# Patient Record
Sex: Female | Born: 1975 | Race: White | Hispanic: No | Marital: Married | Smoking: Never smoker
Health system: Southern US, Community
[De-identification: ages and names within clinical notes are randomized; demographics above are authoritative.]

## PROBLEM LIST (undated history)

## (undated) DIAGNOSIS — G4730 Sleep apnea, unspecified: Secondary | ICD-10-CM

## (undated) DIAGNOSIS — I10 Essential (primary) hypertension: Secondary | ICD-10-CM

## (undated) DIAGNOSIS — M109 Gout, unspecified: Secondary | ICD-10-CM

## (undated) DIAGNOSIS — E669 Obesity, unspecified: Secondary | ICD-10-CM

## (undated) DIAGNOSIS — E119 Type 2 diabetes mellitus without complications: Secondary | ICD-10-CM

## (undated) DIAGNOSIS — F419 Anxiety disorder, unspecified: Secondary | ICD-10-CM

## (undated) DIAGNOSIS — M1990 Unspecified osteoarthritis, unspecified site: Secondary | ICD-10-CM

## (undated) HISTORY — DX: Type 2 diabetes mellitus without complications: E11.9

## (undated) HISTORY — DX: Obesity, unspecified: E66.9

## (undated) HISTORY — DX: Unspecified osteoarthritis, unspecified site: M19.90

## (undated) HISTORY — PX: DILATION AND CURETTAGE OF UTERUS: SHX78

## (undated) HISTORY — DX: Gout, unspecified: M10.9

## (undated) HISTORY — DX: Sleep apnea, unspecified: G47.30

## (undated) HISTORY — PX: OTHER SURGICAL HISTORY: SHX169

## (undated) HISTORY — DX: Anxiety disorder, unspecified: F41.9

---

## 2000-05-26 ENCOUNTER — Encounter: Admission: RE | Admit: 2000-05-26 | Discharge: 2000-08-24 | Payer: Self-pay | Admitting: *Deleted

## 2000-07-27 ENCOUNTER — Emergency Department (HOSPITAL_COMMUNITY): Admission: EM | Admit: 2000-07-27 | Discharge: 2000-07-27 | Payer: Self-pay | Admitting: Emergency Medicine

## 2000-09-06 ENCOUNTER — Encounter: Payer: Self-pay | Admitting: *Deleted

## 2000-09-06 ENCOUNTER — Encounter: Admission: RE | Admit: 2000-09-06 | Discharge: 2000-09-06 | Payer: Self-pay | Admitting: *Deleted

## 2000-09-15 ENCOUNTER — Emergency Department (HOSPITAL_COMMUNITY): Admission: EM | Admit: 2000-09-15 | Discharge: 2000-09-15 | Payer: Self-pay | Admitting: Emergency Medicine

## 2000-10-30 ENCOUNTER — Encounter: Admission: RE | Admit: 2000-10-30 | Discharge: 2000-10-30 | Payer: Self-pay | Admitting: Gastroenterology

## 2000-10-30 ENCOUNTER — Encounter: Payer: Self-pay | Admitting: Gastroenterology

## 2000-11-17 ENCOUNTER — Encounter: Payer: Self-pay | Admitting: Gastroenterology

## 2000-11-17 ENCOUNTER — Ambulatory Visit (HOSPITAL_COMMUNITY): Admission: RE | Admit: 2000-11-17 | Discharge: 2000-11-17 | Payer: Self-pay | Admitting: Gastroenterology

## 2000-12-10 ENCOUNTER — Emergency Department (HOSPITAL_COMMUNITY): Admission: EM | Admit: 2000-12-10 | Discharge: 2000-12-11 | Payer: Self-pay | Admitting: *Deleted

## 2001-03-06 ENCOUNTER — Encounter: Payer: Self-pay | Admitting: Orthopaedic Surgery

## 2001-03-06 ENCOUNTER — Ambulatory Visit (HOSPITAL_COMMUNITY): Admission: RE | Admit: 2001-03-06 | Discharge: 2001-03-06 | Payer: Self-pay | Admitting: Orthopaedic Surgery

## 2001-09-21 ENCOUNTER — Emergency Department (HOSPITAL_COMMUNITY): Admission: EM | Admit: 2001-09-21 | Discharge: 2001-09-21 | Payer: Self-pay | Admitting: Emergency Medicine

## 2001-09-25 ENCOUNTER — Other Ambulatory Visit: Admission: RE | Admit: 2001-09-25 | Discharge: 2001-09-25 | Payer: Self-pay | Admitting: *Deleted

## 2002-08-21 ENCOUNTER — Emergency Department (HOSPITAL_COMMUNITY): Admission: EM | Admit: 2002-08-21 | Discharge: 2002-08-22 | Payer: Self-pay | Admitting: Emergency Medicine

## 2002-10-14 ENCOUNTER — Other Ambulatory Visit: Admission: RE | Admit: 2002-10-14 | Discharge: 2002-10-14 | Payer: Self-pay | Admitting: *Deleted

## 2003-04-09 ENCOUNTER — Emergency Department (HOSPITAL_COMMUNITY): Admission: EM | Admit: 2003-04-09 | Discharge: 2003-04-09 | Payer: Self-pay

## 2004-03-25 ENCOUNTER — Emergency Department (HOSPITAL_COMMUNITY): Admission: EM | Admit: 2004-03-25 | Discharge: 2004-03-25 | Payer: Self-pay | Admitting: Family Medicine

## 2004-07-11 ENCOUNTER — Emergency Department (HOSPITAL_COMMUNITY): Admission: EM | Admit: 2004-07-11 | Discharge: 2004-07-11 | Payer: Self-pay | Admitting: Family Medicine

## 2004-08-09 ENCOUNTER — Encounter (INDEPENDENT_AMBULATORY_CARE_PROVIDER_SITE_OTHER): Payer: Self-pay | Admitting: Specialist

## 2004-08-09 ENCOUNTER — Ambulatory Visit (HOSPITAL_COMMUNITY): Admission: RE | Admit: 2004-08-09 | Discharge: 2004-08-09 | Payer: Self-pay | Admitting: *Deleted

## 2005-12-13 ENCOUNTER — Encounter: Admission: RE | Admit: 2005-12-13 | Discharge: 2005-12-13 | Payer: Self-pay | Admitting: *Deleted

## 2006-01-20 ENCOUNTER — Encounter: Admission: RE | Admit: 2006-01-20 | Discharge: 2006-01-20 | Payer: Self-pay | Admitting: *Deleted

## 2007-01-22 ENCOUNTER — Encounter: Admission: RE | Admit: 2007-01-22 | Discharge: 2007-01-22 | Payer: Self-pay | Admitting: *Deleted

## 2008-01-24 ENCOUNTER — Encounter: Admission: RE | Admit: 2008-01-24 | Discharge: 2008-01-24 | Payer: Self-pay | Admitting: Obstetrics

## 2008-03-08 ENCOUNTER — Emergency Department (HOSPITAL_COMMUNITY): Admission: EM | Admit: 2008-03-08 | Discharge: 2008-03-08 | Payer: Self-pay | Admitting: Emergency Medicine

## 2009-02-03 ENCOUNTER — Encounter: Admission: RE | Admit: 2009-02-03 | Discharge: 2009-02-03 | Payer: Self-pay | Admitting: Obstetrics

## 2010-02-23 ENCOUNTER — Encounter: Admission: RE | Admit: 2010-02-23 | Discharge: 2010-02-23 | Payer: Self-pay | Admitting: Obstetrics

## 2010-05-01 ENCOUNTER — Inpatient Hospital Stay (HOSPITAL_COMMUNITY): Admission: AD | Admit: 2010-05-01 | Payer: Self-pay | Admitting: Obstetrics and Gynecology

## 2010-05-22 ENCOUNTER — Encounter: Payer: Self-pay | Admitting: Obstetrics

## 2010-09-17 NOTE — Op Note (Signed)
NAMESHARYL, PANCHAL             ACCOUNT NO.:  [id number]   MEDICAL RECORD NO.:  [id number]          PATIENT TYPE:  AMB   LOCATION:  SDC                           FACILITY:  [facility]   PHYSICIAN:  [place] B. Earlene Plater, M.D.  DATE OF BIRTH:  Jan 05, 1976   DATE OF PROCEDURE:  08/09/2004  DATE OF DISCHARGE:                                 OPERATIVE REPORT   PREOPERATIVE DIAGNOSIS:  Abnormal uterine bleeding.   POSTOPERATIVE DIAGNOSIS:  Abnormal uterine bleeding.   PROCEDURE:  Hysteroscopy, dilatation and curettage.   SURGEON:  Chester Holstein. Earlene Plater, M.D.   ANESTHESIA:  LMA general.   ASSISTANT:  None.   FINDINGS:  Abundant proliferative-appearing endometrial tissue, normal-  appearing tubal ostia, no other abnormalities.   SPECIMENS:  Endometrial curettings.   ESTIMATED BLOOD LOSS:  Less than 50.   COMPLICATIONS:  None.   FLUID DEFICIT:  20 mL sorbitol.   INDICATIONS:  Patient with a history of obesity, oligo-ovulation, and recent  history of increased heavy menstrual bleeding.  Pelvic ultrasound showed a  very thickened endometrial stripe.  Pregnancy was ruled out.  Given her  heavy bleeding, I recommended D&C for therapeutic purposes as she was not  responding to medical treatment and for definitive diagnosis along with  hysteroscopy.  The operative risks were discussed, including infection,  bleeding, uterine perforation, damage to surrounding organs, and fluid  overload.   PROCEDURE:  Patient taken to the operating room and LMA general anesthesia  obtained.  She was prepped and draped in standard fashion and the bladder  emptied with an in-and-out catheter.   Exam under anesthesia showed a normal-sized uterus, no masses palpable in  the adnexa, although the exam was somewhat limited by her obesity.   Speculum inserted, paracervical block placed with 20 mL of 1% Nesacaine.   A single-tooth attached to the cervix and the cervix was patulent after one  dose of  Cytotec last night.   It allowed passage up to the #21 dilator  without resistance.   The diagnostic hysteroscope was inserted after being flushed with sorbitol.  With good uterine distention, the endometrial cavity was inspected.  There  was a very proliferative appearance, particularly at the fundus.  No other  focal lesions were seen.  The endometrium was then curetted and copious  tissue returned.  The scope was reinserted and no other abnormalities were  noted, and it was clear that the majority of the endometrium had been  sharply curetted successfully.   The scope was removed and the single-tooth removed.  The single-tooth site  was bleeding.  It was made hemostatic with silver nitrate stick and direct  pressure.   The patient tolerated the procedure well with no complications.  She was  taken to the recovery room awake, in stable condition.      WBD/MEDQ  D:  08/09/2004  T:  08/09/2004  Job:  [id number]

## 2010-12-23 ENCOUNTER — Other Ambulatory Visit: Payer: Self-pay | Admitting: Obstetrics

## 2010-12-23 DIAGNOSIS — Z1231 Encounter for screening mammogram for malignant neoplasm of breast: Secondary | ICD-10-CM

## 2011-02-25 ENCOUNTER — Ambulatory Visit
Admission: RE | Admit: 2011-02-25 | Discharge: 2011-02-25 | Disposition: A | Discharge status: Home / Self Care | Payer: BC Managed Care – PPO | Source: Ambulatory Visit | Attending: Obstetrics | Admitting: Obstetrics

## 2011-02-25 DIAGNOSIS — Z1231 Encounter for screening mammogram for malignant neoplasm of breast: Secondary | ICD-10-CM

## 2012-02-08 ENCOUNTER — Other Ambulatory Visit: Payer: Self-pay | Admitting: Obstetrics

## 2012-02-08 ENCOUNTER — Other Ambulatory Visit: Payer: Self-pay | Admitting: *Deleted

## 2012-02-08 DIAGNOSIS — Z1231 Encounter for screening mammogram for malignant neoplasm of breast: Secondary | ICD-10-CM

## 2012-02-28 ENCOUNTER — Ambulatory Visit: Payer: BC Managed Care – PPO

## 2012-03-07 ENCOUNTER — Ambulatory Visit
Admission: RE | Admit: 2012-03-07 | Discharge: 2012-03-07 | Disposition: A | Discharge status: Home / Self Care | Payer: BC Managed Care – PPO | Source: Ambulatory Visit | Attending: Obstetrics | Admitting: Obstetrics

## 2012-03-07 DIAGNOSIS — Z1231 Encounter for screening mammogram for malignant neoplasm of breast: Secondary | ICD-10-CM

## 2013-03-05 ENCOUNTER — Other Ambulatory Visit: Payer: Self-pay | Admitting: Obstetrics

## 2013-03-05 DIAGNOSIS — Z803 Family history of malignant neoplasm of breast: Secondary | ICD-10-CM

## 2013-03-05 DIAGNOSIS — Z1231 Encounter for screening mammogram for malignant neoplasm of breast: Secondary | ICD-10-CM

## 2013-03-29 ENCOUNTER — Ambulatory Visit
Admission: RE | Admit: 2013-03-29 | Discharge: 2013-03-29 | Disposition: A | Discharge status: Home / Self Care | Payer: BC Managed Care – PPO | Source: Ambulatory Visit | Attending: Obstetrics | Admitting: Obstetrics

## 2013-03-29 DIAGNOSIS — Z1231 Encounter for screening mammogram for malignant neoplasm of breast: Secondary | ICD-10-CM

## 2013-03-29 DIAGNOSIS — Z803 Family history of malignant neoplasm of breast: Secondary | ICD-10-CM

## 2019-06-18 ENCOUNTER — Other Ambulatory Visit: Payer: Self-pay

## 2019-06-19 ENCOUNTER — Encounter: Payer: Self-pay | Admitting: Physician Assistant

## 2019-06-19 ENCOUNTER — Ambulatory Visit (INDEPENDENT_AMBULATORY_CARE_PROVIDER_SITE_OTHER): Payer: 59 | Admitting: Physician Assistant

## 2019-06-19 VITALS — BP 166/89 | HR 96 | Temp 98.4°F | Ht 64.0 in | Wt 320.0 lb

## 2019-06-19 DIAGNOSIS — K219 Gastro-esophageal reflux disease without esophagitis: Secondary | ICD-10-CM

## 2019-06-19 DIAGNOSIS — E559 Vitamin D deficiency, unspecified: Secondary | ICD-10-CM | POA: Diagnosis not present

## 2019-06-19 DIAGNOSIS — I10 Essential (primary) hypertension: Secondary | ICD-10-CM

## 2019-06-19 DIAGNOSIS — E282 Polycystic ovarian syndrome: Secondary | ICD-10-CM | POA: Insufficient documentation

## 2019-06-19 DIAGNOSIS — M1A9XX0 Chronic gout, unspecified, without tophus (tophi): Secondary | ICD-10-CM

## 2019-06-19 DIAGNOSIS — Z0000 Encounter for general adult medical examination without abnormal findings: Secondary | ICD-10-CM

## 2019-06-19 DIAGNOSIS — E119 Type 2 diabetes mellitus without complications: Secondary | ICD-10-CM | POA: Diagnosis not present

## 2019-06-19 DIAGNOSIS — F419 Anxiety disorder, unspecified: Secondary | ICD-10-CM

## 2019-06-19 DIAGNOSIS — M109 Gout, unspecified: Secondary | ICD-10-CM | POA: Insufficient documentation

## 2019-06-19 LAB — BAYER DCA HB A1C WAIVED: HB A1C (BAYER DCA - WAIVED): 9.4 % — ABNORMAL HIGH (ref <7.0)

## 2019-06-19 MED ORDER — VITAMIN D (ERGOCALCIFEROL) 1.25 MG (50000 UNIT) PO CAPS: 50000.0000 [IU] | ORAL_CAPSULE | ORAL | 3 refills | Status: DC

## 2019-06-19 MED ORDER — LORAZEPAM 0.5 MG PO TABS: ORAL_TABLET | ORAL | 1 refills | Status: DC

## 2019-06-19 MED ORDER — ALLOPURINOL 100 MG PO TABS: 200.0000 mg | ORAL_TABLET | Freq: Every day | ORAL | 5 refills | Status: DC

## 2019-06-19 MED ORDER — ESCITALOPRAM OXALATE 10 MG PO TABS: 10.0000 mg | ORAL_TABLET | Freq: Every day | ORAL | 5 refills | Status: DC

## 2019-06-19 MED ORDER — CETIRIZINE HCL 10 MG PO TABS: 10.0000 mg | ORAL_TABLET | Freq: Every day | ORAL | 11 refills | Status: DC | PRN

## 2019-06-19 MED ORDER — SITAGLIPTIN PHOSPHATE 50 MG PO TABS: 50.0000 mg | ORAL_TABLET | Freq: Every day | ORAL | 5 refills | Status: DC

## 2019-06-19 MED ORDER — DICLOFENAC SODIUM ER 100 MG PO TB24: 100.0000 mg | ORAL_TABLET | Freq: Every day | ORAL | 5 refills | Status: DC

## 2019-06-19 MED ORDER — ESOMEPRAZOLE MAGNESIUM 40 MG PO CPDR: 40.0000 mg | DELAYED_RELEASE_CAPSULE | Freq: Every day | ORAL | 11 refills | Status: DC

## 2019-06-19 MED ORDER — METFORMIN HCL 1000 MG PO TABS: 1000.0000 mg | ORAL_TABLET | Freq: Two times a day (BID) | ORAL | 5 refills | Status: DC

## 2019-06-19 MED ORDER — LOSARTAN POTASSIUM 50 MG PO TABS: 50.0000 mg | ORAL_TABLET | Freq: Every day | ORAL | 3 refills | Status: DC

## 2019-06-20 LAB — CBC WITH DIFFERENTIAL/PLATELET
Basophils Absolute: 0.1 10*3/uL (ref 0.0–0.2)
Basos: 1 %
EOS (ABSOLUTE): 0.3 10*3/uL (ref 0.0–0.4)
Eos: 3 %
Hematocrit: 42.6 % (ref 34.0–46.6)
Hemoglobin: 14.2 g/dL (ref 11.1–15.9)
Immature Grans (Abs): 0 10*3/uL (ref 0.0–0.1)
Immature Granulocytes: 0 %
Lymphocytes Absolute: 2.6 10*3/uL (ref 0.7–3.1)
Lymphs: 30 %
MCH: 29.3 pg (ref 26.6–33.0)
MCHC: 33.3 g/dL (ref 31.5–35.7)
MCV: 88 fL (ref 79–97)
Monocytes Absolute: 0.5 10*3/uL (ref 0.1–0.9)
Monocytes: 6 %
Neutrophils Absolute: 5.1 10*3/uL (ref 1.4–7.0)
Neutrophils: 60 %
Platelets: 295 10*3/uL (ref 150–450)
RBC: 4.85 x10E6/uL (ref 3.77–5.28)
RDW: 13.8 % (ref 11.7–15.4)
WBC: 8.6 10*3/uL (ref 3.4–10.8)

## 2019-06-20 LAB — CMP14+EGFR
ALT: 39 IU/L — ABNORMAL HIGH (ref 0–32)
AST: 45 IU/L — ABNORMAL HIGH (ref 0–40)
Albumin/Globulin Ratio: 1.7 (ref 1.2–2.2)
Albumin: 4.5 g/dL (ref 3.8–4.8)
Alkaline Phosphatase: 74 IU/L (ref 39–117)
BUN/Creatinine Ratio: 18 (ref 9–23)
BUN: 11 mg/dL (ref 6–24)
Bilirubin Total: 0.3 mg/dL (ref 0.0–1.2)
CO2: 25 mmol/L (ref 20–29)
Calcium: 9.7 mg/dL (ref 8.7–10.2)
Chloride: 96 mmol/L (ref 96–106)
Creatinine, Ser: 0.61 mg/dL (ref 0.57–1.00)
GFR calc Af Amer: 130 mL/min/{1.73_m2} (ref >59)
GFR calc non Af Amer: 113 mL/min/{1.73_m2} (ref >59)
Globulin, Total: 2.6 g/dL (ref 1.5–4.5)
Glucose: 225 mg/dL — ABNORMAL HIGH (ref 65–99)
Potassium: 4.4 mmol/L (ref 3.5–5.2)
Sodium: 138 mmol/L (ref 134–144)
Total Protein: 7.1 g/dL (ref 6.0–8.5)

## 2019-06-20 LAB — LIPID PANEL
Chol/HDL Ratio: 6.8 ratio — ABNORMAL HIGH (ref 0.0–4.4)
Cholesterol, Total: 250 mg/dL — ABNORMAL HIGH (ref 100–199)
HDL: 37 mg/dL — ABNORMAL LOW (ref >39)
LDL Chol Calc (NIH): 135 mg/dL — ABNORMAL HIGH (ref 0–99)
Triglycerides: 430 mg/dL — ABNORMAL HIGH (ref 0–149)
VLDL Cholesterol Cal: 78 mg/dL — ABNORMAL HIGH (ref 5–40)

## 2019-06-20 LAB — TSH: TSH: 3.89 u[IU]/mL (ref 0.450–4.500)

## 2019-06-24 ENCOUNTER — Other Ambulatory Visit: Payer: Self-pay | Admitting: *Deleted

## 2019-06-24 ENCOUNTER — Encounter: Payer: Self-pay | Admitting: Physician Assistant

## 2019-06-24 MED ORDER — CONTOUR NEXT TEST VI STRP: ORAL_STRIP | 3 refills | Status: DC

## 2019-06-24 NOTE — Progress Notes (Signed)
BP (!) 166/89   Pulse 96   Temp 98.4 F (36.9 C) (Temporal)   Ht _0  (1.626 m)   Wt (!) 320 lb (145.2 kg)   SpO2 96%   BMI 54.93 kg/m    Subjective:    Patient ID: Glenda Mitchell, female    DOB: [birth year], 44 y.o.   MRN: [id number]  Diabetes She presents for her follow-up diabetic visit. She has type 2 diabetes mellitus. Her disease course has been fluctuating. Hypoglycemia symptoms include nervousness/anxiousness. Pertinent negatives for diabetes include no chest pain and no fatigue. Risk factors for coronary artery disease include diabetes mellitus, dyslipidemia and obesity. She is following a generally healthy diet. An ACE inhibitor/angiotensin II receptor blocker is being taken.  Gastroesophageal Reflux She complains of abdominal pain and heartburn. She reports no chest pain or no coughing. The current episode started more than 1 year ago. The problem has been waxing and waning. Pertinent negatives include no fatigue.  Hypertension This is a chronic problem. The current episode started more than 1 year ago. The problem has been gradually improving since onset. Pertinent negatives include no chest pain. Risk factors for coronary artery disease include diabetes mellitus, dyslipidemia and obesity. Past treatments include angiotensin blockers. The current treatment provides moderate improvement.  Arthritis Presents for follow-up visit. She complains of pain, joint swelling and joint warmth. The symptoms have been resolved. Pertinent negatives include no dysuria, fatigue or fever.     HPI: Glenda Mitchell is a 44 y.o. female presenting on 06/19/2019 for New Patient (Initial Visit)    Past Medical History:  Diagnosis Date  . Diabetes mellitus without complication ([place])   . Gout    Relevant past medical, surgical, family and social history reviewed and updated as indicated. Interim medical history since our last visit reviewed. Allergies and medications reviewed and  updated. DATA REVIEWED: CHART IN EPIC  Family History reviewed for pertinent findings.  Review of Systems  Constitutional: Negative.  Negative for activity change, fatigue and fever.  HENT: Negative.   Eyes: Negative.   Respiratory: Negative.  Negative for cough.   Cardiovascular: Negative.  Negative for chest pain.  Gastrointestinal: Positive for abdominal pain and heartburn.  Endocrine: Negative.   Genitourinary: Negative.  Negative for dysuria.  Musculoskeletal: Positive for arthritis and joint swelling.  Skin: Negative.   Neurological: Negative.   Psychiatric/Behavioral: The patient is nervous/anxious.     Allergies as of 06/19/2019      Reactions   Clarithromycin Other (See Comments)   Chest pain and SOB      Medication List       Accurate as of June 19, 2019 11:59 PM. If you have any questions, ask your nurse or doctor.        allopurinol 100 MG tablet Commonly known as: ZYLOPRIM Take 2 tablets (200 mg total) by mouth daily.   cetirizine 10 MG tablet Commonly known as: ZYRTEC Take 1 tablet (10 mg total) by mouth daily as needed.   Contour Next Test test strip Generic drug: glucose blood 1 each daily.   Diclofenac Sodium CR 100 MG 24 hr tablet Take 1 tablet (100 mg total) by mouth daily.   escitalopram 10 MG tablet Commonly known as: Lexapro Take 1 tablet (10 mg total) by mouth daily. Started by: Terald Sleeper, PA-C   esomeprazole 40 MG capsule Commonly known as: NEXIUM Take 1 capsule (40 mg total) by mouth daily.   LORazepam 0.5 MG tablet Commonly known as:  ATIVAN SMARTSIG:1 Tablet(s) By Mouth Every 12 Hours PRN   losartan 50 MG tablet Commonly known as: COZAAR Take 1 tablet (50 mg total) by mouth daily. Started by: Terald Sleeper, PA-C   medroxyPROGESTERone 10 MG tablet Commonly known as: PROVERA TAKE 1 TABLET BY MOUTH ONCE DAILY FOR 10 DAYS EVERY 3 MONTHS   metFORMIN 1000 MG tablet Commonly known as: GLUCOPHAGE Take 1 tablet (1,000 mg  total) by mouth 2 (two) times daily.   sitaGLIPtin 50 MG tablet Commonly known as: Januvia Take 1 tablet (50 mg total) by mouth daily. Started by: Terald Sleeper, PA-C   Vitamin D (Ergocalciferol) 1.25 MG (50000 UNIT) Caps capsule Commonly known as: DRISDOL Take 1 capsule (50,000 Units total) by mouth 2 (two) times a week.          Objective:    BP (!) 166/89   Pulse 96   Temp 98.4 F (36.9 C) (Temporal)   Ht _0  (1.626 m)   Wt (!) 320 lb (145.2 kg)   SpO2 96%   BMI 54.93 kg/m   Allergies  Allergen Reactions  . Clarithromycin Other (See Comments)    Chest pain and SOB    Wt Readings from Last 3 Encounters:  06/19/19 (!) 320 lb (145.2 kg)    Physical Exam Constitutional:      Appearance: She is well-developed.  HENT:     Head: Normocephalic and atraumatic.     Right Ear: Tympanic membrane, ear canal and external ear normal.     Left Ear: Tympanic membrane, ear canal and external ear normal.     Nose: Nose normal. No rhinorrhea.     Mouth/Throat:     Pharynx: No oropharyngeal exudate or posterior oropharyngeal erythema.  Eyes:     Conjunctiva/sclera: Conjunctivae normal.     Pupils: Pupils are equal, round, and reactive to light.  Cardiovascular:     Rate and Rhythm: Normal rate and regular rhythm.     Heart sounds: Normal heart sounds.  Pulmonary:     Effort: Pulmonary effort is normal.     Breath sounds: Normal breath sounds.  Abdominal:     General: Bowel sounds are normal.     Palpations: Abdomen is soft.  Musculoskeletal:     Cervical back: Normal range of motion and neck supple.  Skin:    General: Skin is warm and dry.     Findings: No rash.  Neurological:     Mental Status: She is alert and oriented to person, place, and time.     Deep Tendon Reflexes: Reflexes are normal and symmetric.  Psychiatric:        Behavior: Behavior normal.        Thought Content: Thought content normal.        Judgment: Judgment normal.     Results for orders  placed or performed in visit on 06/19/19  CBC with Differential/Platelet  Result Value Ref Range   WBC 8.6 3.4 - 10.8 x10E3/uL   RBC 4.85 3.77 - 5.28 x10E6/uL   Hemoglobin 14.2 11.1 - 15.9 g/dL   Hematocrit 42.6 34.0 - 46.6 %   MCV 88 79 - 97 fL   MCH 29.3 26.6 - 33.0 pg   MCHC 33.3 31.5 - 35.7 g/dL   RDW 13.8 11.7 - 15.4 %   Platelets 295 150 - 450 x10E3/uL   Neutrophils 60 Not Estab. %   Lymphs 30 Not Estab. %   Monocytes 6 Not Estab. %   Eos  3 Not Estab. %   Basos 1 Not Estab. %   Neutrophils Absolute 5.1 1.4 - 7.0 x10E3/uL   Lymphocytes Absolute 2.6 0.7 - 3.1 x10E3/uL   Monocytes Absolute 0.5 0.1 - 0.9 x10E3/uL   EOS (ABSOLUTE) 0.3 0.0 - 0.4 x10E3/uL   Basophils Absolute 0.1 0.0 - 0.2 x10E3/uL   Immature Granulocytes 0 Not Estab. %   Immature Grans (Abs) 0.0 0.0 - 0.1 x10E3/uL  CMP14+EGFR  Result Value Ref Range   Glucose 225 (H) 65 - 99 mg/dL   BUN 11 6 - 24 mg/dL   Creatinine, Ser 0.61 0.57 - 1.00 mg/dL   GFR calc non Af Amer 113 >59 mL/min/1.73   GFR calc Af Amer 130 >59 mL/min/1.73   BUN/Creatinine Ratio 18 9 - 23   Sodium 138 134 - 144 mmol/L   Potassium 4.4 3.5 - 5.2 mmol/L   Chloride 96 96 - 106 mmol/L   CO2 25 20 - 29 mmol/L   Calcium 9.7 8.7 - 10.2 mg/dL   Total Protein 7.1 6.0 - 8.5 g/dL   Albumin 4.5 3.8 - 4.8 g/dL   Globulin, Total 2.6 1.5 - 4.5 g/dL   Albumin/Globulin Ratio 1.7 1.2 - 2.2   Bilirubin Total 0.3 0.0 - 1.2 mg/dL   Alkaline Phosphatase 74 39 - 117 IU/L   AST 45 (H) 0 - 40 IU/L   ALT 39 (H) 0 - 32 IU/L  Lipid panel  Result Value Ref Range   Cholesterol, Total 250 (H) 100 - 199 mg/dL   Triglycerides 430 (H) 0 - 149 mg/dL   HDL 37 (L) >39 mg/dL   VLDL Cholesterol Cal 78 (H) 5 - 40 mg/dL   LDL Chol Calc (NIH) 135 (H) 0 - 99 mg/dL   Chol/HDL Ratio 6.8 (H) 0.0 - 4.4 ratio  TSH  Result Value Ref Range   TSH 3.890 0.450 - 4.500 uIU/mL  Bayer DCA Hb A1c Waived  Result Value Ref Range   HB A1C (BAYER DCA - WAIVED) 9.4 (H) <7.0 %       Assessment & Plan:   1. Diabetes mellitus without complication (HCC) - sitaGLIPtin (JANUVIA) 50 MG tablet; Take 1 tablet (50 mg total) by mouth daily.  Dispense: 30 tablet; Refill: 5 - metFORMIN (GLUCOPHAGE) 1000 MG tablet; Take 1 tablet (1,000 mg total) by mouth 2 (two) times daily.  Dispense: 60 tablet; Refill: 5 - CBC with Differential/Platelet - CMP14+EGFR - Lipid panel - TSH - Bayer DCA Hb A1c Waived  2. Chronic gout without tophus, unspecified cause, unspecified site - allopurinol (ZYLOPRIM) 100 MG tablet; Take 2 tablets (200 mg total) by mouth daily.  Dispense: 60 tablet; Refill: 5 - Diclofenac Sodium CR 100 MG 24 hr tablet; Take 1 tablet (100 mg total) by mouth daily.  Dispense: 60 tablet; Refill: 5  3. Gastroesophageal reflux disease without esophagitis - esomeprazole (NEXIUM) 40 MG capsule; Take 1 capsule (40 mg total) by mouth daily.  Dispense: 30 capsule; Refill: 11  4. Vitamin D deficiency - Vitamin D, Ergocalciferol, (DRISDOL) 1.25 MG (50000 UNIT) CAPS capsule; Take 1 capsule (50,000 Units total) by mouth 2 (two) times a week.  Dispense: 10 capsule; Refill: 3  5. PCOS (polycystic ovarian syndrome) - medroxyPROGESTERone (PROVERA) 10 MG tablet; TAKE 1 TABLET BY MOUTH ONCE DAILY FOR 10 DAYS EVERY 3 MONTHS  6. Well adult exam - CBC with Differential/Platelet - CMP14+EGFR - Lipid panel - TSH - Bayer DCA Hb A1c Waived  7. Essential hypertension - losartan (COZAAR)  50 MG tablet; Take 1 tablet (50 mg total) by mouth daily.  Dispense: 90 tablet; Refill: 3  8. Anxiety - LORazepam (ATIVAN) 0.5 MG tablet; SMARTSIG:1 Tablet(s) By Mouth Every 12 Hours PRN  Dispense: 30 tablet; Refill: 1   Continue all other maintenance medications as listed above.  Follow up plan: Return in about 4 weeks (around 07/17/2019).  Educational handout given for carb counting  Terald Sleeper PA-C [place] [address]  [place], [place]  [address] [phone]   06/24/2019, 4:38 PM

## 2019-06-26 ENCOUNTER — Other Ambulatory Visit: Payer: Self-pay | Admitting: *Deleted

## 2019-06-26 MED ORDER — ONETOUCH VERIO VI STRP: ORAL_STRIP | 3 refills | Status: DC

## 2019-07-01 ENCOUNTER — Other Ambulatory Visit: Payer: Self-pay

## 2019-07-01 DIAGNOSIS — E119 Type 2 diabetes mellitus without complications: Secondary | ICD-10-CM

## 2019-07-01 DIAGNOSIS — I10 Essential (primary) hypertension: Secondary | ICD-10-CM

## 2019-07-04 ENCOUNTER — Telehealth: Payer: Self-pay | Admitting: Physician Assistant

## 2019-07-04 ENCOUNTER — Other Ambulatory Visit: Payer: Self-pay | Admitting: Physician Assistant

## 2019-07-04 MED ORDER — ONETOUCH VERIO W/DEVICE KIT: 1.0000 | PACK | Freq: Three times a day (TID) | 11 refills | Status: DC

## 2019-07-04 NOTE — Telephone Encounter (Signed)
Patient aware.

## 2019-07-04 NOTE — Telephone Encounter (Signed)
Sent!

## 2019-07-04 NOTE — Telephone Encounter (Signed)
Please send in meter and lancets and send back to [facility].

## 2019-07-05 NOTE — Telephone Encounter (Signed)
sent 

## 2019-07-09 ENCOUNTER — Telehealth: Payer: Self-pay | Admitting: Physician Assistant

## 2019-07-09 MED ORDER — ONETOUCH ULTRASOFT LANCETS MISC: 12 refills | Status: AC

## 2019-07-09 NOTE — Telephone Encounter (Signed)
Lancets sent into pharmacy. Patient aware and verbalized understanding.

## 2019-07-19 ENCOUNTER — Ambulatory Visit: Payer: 59 | Admitting: Physician Assistant

## 2019-08-13 ENCOUNTER — Ambulatory Visit: Payer: 59 | Admitting: Physician Assistant

## 2019-08-26 ENCOUNTER — Ambulatory Visit (INDEPENDENT_AMBULATORY_CARE_PROVIDER_SITE_OTHER): Payer: 59 | Admitting: Family

## 2019-08-26 ENCOUNTER — Encounter: Payer: Self-pay | Admitting: Family

## 2019-08-26 ENCOUNTER — Other Ambulatory Visit: Payer: Self-pay

## 2019-08-26 DIAGNOSIS — J069 Acute upper respiratory infection, unspecified: Secondary | ICD-10-CM

## 2019-08-26 MED ORDER — FLUTICASONE PROPIONATE 50 MCG/ACT NA SUSP: 2.0000 | Freq: Every day | NASAL | 6 refills | Status: DC

## 2019-08-26 MED ORDER — PREDNISONE 20 MG PO TABS: ORAL_TABLET | ORAL | 0 refills | Status: DC

## 2019-08-26 NOTE — Progress Notes (Signed)
   Virtual Visit via telephone Note Due to COVID-19 pandemic this visit was conducted virtually. This visit type was conducted due to national recommendations for restrictions regarding the COVID-19 Pandemic (e.g. social distancing, sheltering in place) in an effort to limit this patient's exposure and mitigate transmission in our community. All issues noted in this document were discussed and addressed.  A physical exam was not performed with this format.  I connected with Glenda Mitchell on 08/26/19 at 1:44 pm by telephone and verified that I am speaking with the correct person using two identifiers. Glenda Mitchell is currently located at car and husbands is currently with her during visit. The provider, [name], FNP is located in their office at time of visit.  I discussed the limitations, risks, security and privacy concerns of performing an evaluation and management service by telephone and the availability of in person appointments. I also discussed with the patient that there may be a patient responsible charge related to this service. The patient expressed understanding and agreed to proceed.   History and Present Illness:  URI  This is a new problem. The current episode started yesterday. The problem has been gradually worsening. The maximum temperature recorded prior to her arrival was 101 - 101.9 F. Associated symptoms include congestion, coughing, headaches, nausea, rhinorrhea, sinus pain, sneezing and a sore throat ("slightly"). Pertinent negatives include no ear pain or wheezing. She has tried increased fluids and acetaminophen for the symptoms. The treatment provided mild relief.      Review of Systems  HENT: Positive for congestion, rhinorrhea, sinus pain, sneezing and sore throat ("slightly"). Negative for ear pain.   Respiratory: Positive for cough. Negative for wheezing.   Gastrointestinal: Positive for nausea.  Neurological: Positive for headaches.  All other  systems reviewed and are negative.    Observations/Objective: No SOB or distress noted, hoarse voice  Assessment and Plan: 1. Viral URI - Take meds as prescribed - Use a cool mist humidifier  -Use saline nose sprays frequently -Force fluids -For any cough or congestion  Use plain Mucinex- regular strength or max strength is fine -For fever or aces or pains- take tylenol or ibuprofen. -Throat lozenges if help -Could be flu? Her children are going to be seen today and will let us know if they test positive  - predniSONE (DELTASONE) 20 MG tablet; 2 po at sametime daily for 5 days- start tomorrow  Dispense: 10 tablet; Refill: 0 - fluticasone (FLONASE) 50 MCG/ACT nasal spray; Place 2 sprays into both nostrils daily.  Dispense: 16 g; Refill: 6     I discussed the assessment and treatment plan with the patient. The patient was provided an opportunity to ask questions and all were answered. The patient agreed with the plan and demonstrated an understanding of the instructions.   The patient was advised to call back or seek an in-person evaluation if the symptoms worsen or if the condition fails to improve as anticipated.  The above assessment and management plan was discussed with the patient. The patient verbalized understanding of and has agreed to the management plan. Patient is aware to call the clinic if symptoms persist or worsen. Patient is aware when to return to the clinic for a follow-up visit. Patient educated on when it is appropriate to go to the emergency department.   Time call ended:  2:00 pm  I provided 16 minutes of non-face-to-face time during this encounter.    [name], FNP

## 2019-09-02 ENCOUNTER — Telehealth: Payer: Self-pay | Admitting: Family

## 2019-09-02 MED ORDER — AMOXICILLIN-POT CLAVULANATE 875-125 MG PO TABS: 1.0000 | ORAL_TABLET | Freq: Two times a day (BID) | ORAL | 0 refills | Status: DC

## 2019-09-02 NOTE — Telephone Encounter (Signed)
Patient aware.

## 2019-09-02 NOTE — Telephone Encounter (Signed)
Augmentin Prescription sent to pharmacy   

## 2019-09-02 NOTE — Telephone Encounter (Signed)
lmtcb

## 2019-09-02 NOTE — Telephone Encounter (Signed)
Refer to previous phone note 

## 2019-09-02 NOTE — Telephone Encounter (Signed)
Patient had a tele visit 4/26 with [facility] and was given flonase and prednisone.  Patient is still having symptoms.  Refer to previous message and advise.

## 2019-09-02 NOTE — Telephone Encounter (Signed)
  Incoming Patient Call  09/02/2019  What symptoms do you have? PRESSURE in her nose and under eyes, when she coughs she coughs up yellowish greenish and even looks gray also when she blows her nose  How long have you been sick? About a week  Have you been seen for this problem? Yes televisit last week  If your provider decides to give you a prescription, which pharmacy would you like for it to be sent to? [facility]   Patient informed that this information will be sent to the clinical staff for review and that they should receive a follow up call.

## 2019-10-30 ENCOUNTER — Ambulatory Visit (INDEPENDENT_AMBULATORY_CARE_PROVIDER_SITE_OTHER): Payer: 59

## 2019-10-30 ENCOUNTER — Ambulatory Visit (INDEPENDENT_AMBULATORY_CARE_PROVIDER_SITE_OTHER): Payer: 59 | Admitting: Family

## 2019-10-30 ENCOUNTER — Encounter: Payer: Self-pay | Admitting: *Deleted

## 2019-10-30 ENCOUNTER — Encounter: Payer: Self-pay | Admitting: Family

## 2019-10-30 ENCOUNTER — Other Ambulatory Visit: Payer: Self-pay

## 2019-10-30 VITALS — BP 129/79 | HR 82 | Temp 97.9°F | Ht 64.0 in | Wt 326.2 lb

## 2019-10-30 DIAGNOSIS — K219 Gastro-esophageal reflux disease without esophagitis: Secondary | ICD-10-CM

## 2019-10-30 DIAGNOSIS — F411 Generalized anxiety disorder: Secondary | ICD-10-CM

## 2019-10-30 DIAGNOSIS — M1A9XX0 Chronic gout, unspecified, without tophus (tophi): Secondary | ICD-10-CM | POA: Diagnosis not present

## 2019-10-30 DIAGNOSIS — E1169 Type 2 diabetes mellitus with other specified complication: Secondary | ICD-10-CM

## 2019-10-30 DIAGNOSIS — I10 Essential (primary) hypertension: Secondary | ICD-10-CM | POA: Diagnosis not present

## 2019-10-30 DIAGNOSIS — E282 Polycystic ovarian syndrome: Secondary | ICD-10-CM | POA: Diagnosis not present

## 2019-10-30 DIAGNOSIS — M25571 Pain in right ankle and joints of right foot: Secondary | ICD-10-CM

## 2019-10-30 DIAGNOSIS — E119 Type 2 diabetes mellitus without complications: Secondary | ICD-10-CM | POA: Diagnosis not present

## 2019-10-30 DIAGNOSIS — E559 Vitamin D deficiency, unspecified: Secondary | ICD-10-CM

## 2019-10-30 DIAGNOSIS — F320 Major depressive disorder, single episode, mild: Secondary | ICD-10-CM

## 2019-10-30 LAB — BAYER DCA HB A1C WAIVED: HB A1C (BAYER DCA - WAIVED): 10 % — ABNORMAL HIGH (ref <7.0)

## 2019-10-30 MED ORDER — METFORMIN HCL 1000 MG PO TABS: 1000.0000 mg | ORAL_TABLET | Freq: Two times a day (BID) | ORAL | 5 refills | Status: DC

## 2019-10-30 MED ORDER — ESOMEPRAZOLE MAGNESIUM 40 MG PO CPDR: 40.0000 mg | DELAYED_RELEASE_CAPSULE | Freq: Every day | ORAL | 11 refills | Status: DC

## 2019-10-30 MED ORDER — ALLOPURINOL 100 MG PO TABS: 200.0000 mg | ORAL_TABLET | Freq: Every day | ORAL | 5 refills | Status: DC

## 2019-10-30 MED ORDER — DICLOFENAC SODIUM ER 100 MG PO TB24: 100.0000 mg | ORAL_TABLET | Freq: Every day | ORAL | 5 refills | Status: DC

## 2019-10-30 MED ORDER — SITAGLIPTIN PHOSPHATE 50 MG PO TABS: 50.0000 mg | ORAL_TABLET | Freq: Every day | ORAL | 5 refills | Status: DC

## 2019-10-30 MED ORDER — VITAMIN D (ERGOCALCIFEROL) 1.25 MG (50000 UNIT) PO CAPS: 50000.0000 [IU] | ORAL_CAPSULE | ORAL | 3 refills | Status: DC

## 2019-10-30 MED ORDER — ESCITALOPRAM OXALATE 10 MG PO TABS: 10.0000 mg | ORAL_TABLET | Freq: Every day | ORAL | 5 refills | Status: DC

## 2019-10-30 MED ORDER — LOSARTAN POTASSIUM 50 MG PO TABS: 50.0000 mg | ORAL_TABLET | Freq: Every day | ORAL | 3 refills | Status: DC

## 2019-10-30 NOTE — Progress Notes (Signed)
Subjective:    Patient ID: Glenda Mitchell, female    DOB: 20-Feb-1976, 44 y.o.   MRN: [id number]  Chief Complaint  Patient presents with  . Medical Management of Chronic Issues    [facility] patient   . Foot Pain    [place]   PT presents to the office today to establish care with me. She is followed by GYN annually. She takes provera every 3 months to bring on her menstrual cycle.  Foot Pain This is a recurrent problem. The problem occurs intermittently. The problem has been waxing and waning. Associated symptoms include nausea. The symptoms are aggravated by standing and walking.  Diabetes She presents for her follow-up diabetic visit. She has type 2 diabetes mellitus. Hypoglycemia symptoms include nervousness/anxiousness. Associated symptoms include foot paresthesias. Pertinent negatives for diabetes include no blurred vision. Symptoms are stable. Diabetic complications include nephropathy. Pertinent negatives for diabetic complications include no CVA, heart disease or peripheral neuropathy. Her weight is stable. She is following a generally unhealthy diet. Her overall blood glucose range is 180-200 mg/dl. An ACE inhibitor/angiotensin II receptor blocker is being taken. Eye exam is not current.  Anxiety Presents for follow-up visit. Symptoms include depressed mood, excessive worry, irritability, nausea, nervous/anxious behavior and panic. Symptoms occur most days. The severity of symptoms is moderate. The quality of sleep is good.    Depression        This is a chronic problem.  The current episode started more than 1 year ago.   The onset quality is gradual.   The problem occurs intermittently.  The problem has been waxing and waning since onset.  Associated symptoms include helplessness, hopelessness, irritable, decreased interest and sad.  Past treatments include SSRIs - Selective serotonin reuptake inhibitors.  Compliance with treatment is good.  Past medical  history includes anxiety.   Gastroesophageal Reflux She complains of belching, heartburn and nausea. This is a chronic problem. The current episode started more than 1 year ago. The problem occurs occasionally. The problem has been waxing and waning. She has tried a PPI for the symptoms. The treatment provided moderate relief.   Gout Pt takes allopurinol 200 mg daily. States her last gout flare up was years ago.    Review of Systems  Constitutional: Positive for irritability.  Eyes: Negative for blurred vision.  Gastrointestinal: Positive for heartburn and nausea.  Psychiatric/Behavioral: Positive for depression. The patient is nervous/anxious.   All other systems reviewed and are negative.      Objective:   Physical Exam Vitals reviewed.  Constitutional:      General: She is irritable. She is not in acute distress.    Appearance: She is well-developed.  HENT:     Head: Normocephalic and atraumatic.  Eyes:     Pupils: Pupils are equal, round, and reactive to light.  Neck:     Thyroid: No thyromegaly.  Cardiovascular:     Rate and Rhythm: Normal rate and regular rhythm.     Heart sounds: Normal heart sounds. No murmur heard.   Pulmonary:     Effort: Pulmonary effort is normal. No respiratory distress.     Breath sounds: Normal breath sounds. No wheezing.  Abdominal:     General: Bowel sounds are normal. There is no distension.     Palpations: Abdomen is soft.     Tenderness: There is no abdominal tenderness.  Musculoskeletal:        General: No tenderness. Normal range of motion.  Cervical back: Normal range of motion and neck supple.  Skin:    General: Skin is warm and dry.  Neurological:     Mental Status: She is alert and oriented to person, place, and time.     Cranial Nerves: No cranial nerve deficit.     Deep Tendon Reflexes: Reflexes are normal and symmetric.  Psychiatric:        Behavior: Behavior normal.        Thought Content: Thought content normal.          Judgment: Judgment normal.       Diabetic Foot Exam - Simple   Simple Foot Form Visual Inspection No deformities, no ulcerations, no other skin breakdown bilaterally: Yes Sensation Testing See comments: Yes Pulse Check Posterior Tibialis and Dorsalis pulse intact bilaterally: Yes Comments Negative monofilament in toes, positive monofilament in other areas.        BP 129/79   Pulse 82   Temp 97.9 F (36.6 C) (Temporal)   Ht _0  (1.626 m)   Wt (!) 326 lb 3.2 oz (148 kg)   BMI 55.99 kg/m   Assessment & Plan:  Glenda Mitchell comes in today with chief complaint of Medical Management of Chronic Issues [facility] patient ) and Foot Pain (right fell at the beach last week)   Diagnosis and orders addressed:  1. Chronic gout without tophus, unspecified cause, unspecified site - Diclofenac Sodium CR 100 MG 24 hr tablet; Take 1 tablet (100 mg total) by mouth daily.  Dispense: 60 tablet; Refill: 5 - allopurinol (ZYLOPRIM) 100 MG tablet; Take 2 tablets (200 mg total) by mouth daily.  Dispense: 60 tablet; Refill: 5 - CMP14+EGFR  2. Diabetes mellitus without complication (HCC) - sitaGLIPtin (JANUVIA) 50 MG tablet; Take 1 tablet (50 mg total) by mouth daily.  Dispense: 30 tablet; Refill: 5 - metFORMIN (GLUCOPHAGE) 1000 MG tablet; Take 1 tablet (1,000 mg total) by mouth 2 (two) times daily.  Dispense: 60 tablet; Refill: 5 - CMP14+EGFR  3. Essential hypertension - losartan (COZAAR) 50 MG tablet; Take 1 tablet (50 mg total) by mouth daily.  Dispense: 90 tablet; Refill: 3 - CMP14+EGFR  4. Gastroesophageal reflux disease without esophagitis - esomeprazole (NEXIUM) 40 MG capsule; Take 1 capsule (40 mg total) by mouth daily.  Dispense: 30 capsule; Refill: 11 - CMP14+EGFR  5. Vitamin D deficiency - Vitamin D, Ergocalciferol, (DRISDOL) 1.25 MG (50000 UNIT) CAPS capsule; Take 1 capsule (50,000 Units total) by mouth 2 (two) times a week.  Dispense: 10 capsule; Refill: 3 -  CMP14+EGFR  6. Type 2 diabetes mellitus with other specified complication, without long-term current use of insulin (HCC) - Bayer DCA Hb A1c Waived - CMP14+EGFR  7. PCOS (polycystic ovarian syndrome) - CMP14+EGFR  8. GAD (generalized anxiety disorder) - escitalopram (LEXAPRO) 10 MG tablet; Take 1 tablet (10 mg total) by mouth daily.  Dispense: 30 tablet; Refill: 5 - CMP14+EGFR  9. Depression, major, single episode, mild (HCC) - escitalopram (LEXAPRO) 10 MG tablet; Take 1 tablet (10 mg total) by mouth daily.  Dispense: 30 tablet; Refill: 5 - CMP14+EGFR  10. Acute right ankle pain  - DG Ankle Complete Right; Future   Labs pending Health Maintenance reviewed Diet and exercise encouraged  Follow up plan: 3 months   Evelina Dun, FNP

## 2019-10-30 NOTE — Patient Instructions (Signed)

## 2019-10-31 ENCOUNTER — Other Ambulatory Visit: Payer: Self-pay | Admitting: Family

## 2019-10-31 ENCOUNTER — Telehealth: Payer: Self-pay | Admitting: Family

## 2019-10-31 LAB — CMP14+EGFR
ALT: 36 IU/L — ABNORMAL HIGH (ref 0–32)
AST: 29 IU/L (ref 0–40)
Albumin/Globulin Ratio: 1.5 (ref 1.2–2.2)
Albumin: 4 g/dL (ref 3.8–4.8)
Alkaline Phosphatase: 64 IU/L (ref 48–121)
BUN/Creatinine Ratio: 29 — ABNORMAL HIGH (ref 9–23)
BUN: 16 mg/dL (ref 6–24)
Bilirubin Total: 0.2 mg/dL (ref 0.0–1.2)
CO2: 24 mmol/L (ref 20–29)
Calcium: 9.5 mg/dL (ref 8.7–10.2)
Chloride: 97 mmol/L (ref 96–106)
Creatinine, Ser: 0.55 mg/dL — ABNORMAL LOW (ref 0.57–1.00)
GFR calc Af Amer: 134 mL/min/{1.73_m2} (ref >59)
GFR calc non Af Amer: 116 mL/min/{1.73_m2} (ref >59)
Globulin, Total: 2.7 g/dL (ref 1.5–4.5)
Glucose: 240 mg/dL — ABNORMAL HIGH (ref 65–99)
Potassium: 4.7 mmol/L (ref 3.5–5.2)
Sodium: 138 mmol/L (ref 134–144)
Total Protein: 6.7 g/dL (ref 6.0–8.5)

## 2019-10-31 MED ORDER — OZEMPIC (0.25 OR 0.5 MG/DOSE) 2 MG/1.5ML ~~LOC~~ SOPN: 0.5000 mg | PEN_INJECTOR | SUBCUTANEOUS | 4 refills | Status: DC

## 2019-10-31 NOTE — Telephone Encounter (Signed)
Patient aware of labs.  

## 2019-11-01 NOTE — Telephone Encounter (Signed)
She has an appointment with pharm D next week and will go to get Ozempic to bring with her to the appointment.  Will not take Januvia on day of appointment to begin Ozempic.

## 2019-11-01 NOTE — Telephone Encounter (Signed)
Pt is unsure if she should stop the Januvia today or wait until after her first injection of Ozempic. Please advise.

## 2019-11-01 NOTE — Telephone Encounter (Signed)
Stop medication when starting ozempic

## 2019-11-01 NOTE — Telephone Encounter (Signed)
If plan is to replace Januvia with Ozempic, she should stop day before first ozempic injection

## 2019-11-06 ENCOUNTER — Ambulatory Visit: Payer: 59 | Admitting: Pharmacist

## 2019-11-11 ENCOUNTER — Telehealth: Payer: Self-pay | Admitting: Pharmacist

## 2019-11-11 NOTE — Telephone Encounter (Signed)
appt rescheduled for Friday 7/16 at 10am Patient has not started Ozempic yet (copay was $20) Will educate and provide overall diabetes training

## 2019-11-12 ENCOUNTER — Ambulatory Visit: Payer: 59 | Admitting: Pharmacist

## 2019-11-15 ENCOUNTER — Ambulatory Visit (INDEPENDENT_AMBULATORY_CARE_PROVIDER_SITE_OTHER): Payer: 59 | Admitting: Pharmacist

## 2019-11-15 ENCOUNTER — Other Ambulatory Visit: Payer: Self-pay

## 2019-11-15 DIAGNOSIS — E119 Type 2 diabetes mellitus without complications: Secondary | ICD-10-CM

## 2019-11-15 NOTE — Progress Notes (Signed)
    11/15/2019 Name: Glenda Mitchell MRN: [id number] DOB: [birth year]   S:  57 yoF presents for diabetes evaluation, education, and management Patient was referred and last seen by Primary Care Provider on 10/30/19  Insurance coverage/medication affordability: [employer]  Patient reports adherence with medications. . Current diabetes medications include: metformin . Current hypertension medications include: losartan Goal 130/80 . Current hyperlipidemia medications include: need to discuss statin LDL 135 on 06/19/19 STATIN ALLERGY/INTOLERANCE DOCUMENTED IN EMR (never tried)  Patient denies hypoglycemic events.   Patient reported dietary habits: Eats 3 meals/day Discussed meal planning options and Plate method for healthy eating . Avoid sugary drinks and desserts . Incorporate balanced protein, non starchy veggies, 1 serving of carbohydrate with each meal . Increase water intake . 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    A/P:  Diabetes T2DM currently uncontrolled. Patient is   adherent with medication. Control is suboptimal due to diet/meds not optimized.  -Started GLP-1 Ozempic (generic name: semglutide)  . Instructions: Inject 0.25 mg into the skin weekly for 4 weeks, then increase to 0.5mg  weekly thereafter (as tolerated) . Injection done in office . Denies history of thyroid/medullary cancer  -Continue metformin  -STOP januvia  -Allergy list updated, patient did not tolerate SGLT2 in the past due to recurrent yeast infections  -Will  discuss statin at next visit, per chart review it appears patient has not tried  -Extensively discussed pathophysiology of diabetes, recommended lifestyle interventions, dietary effects on blood sugar control  -Counseled on s/sx of and management of hypoglycemia  -Next A1C anticipated 01/31/20.    Written patient instructions provided.  Total time in face to face counseling 30 minutes.   Follow up PCP Clinic Visit ON 01/31/20.  Kieth Brightly, PharmD, BCPS Clinical Pharmacist, [facility]  II Phone [phone]

## 2019-11-22 ENCOUNTER — Telehealth: Payer: Self-pay | Admitting: Pharmacist

## 2019-11-22 NOTE — Telephone Encounter (Signed)
FBG 200-300 FBG 181  Continue metformin 1g twice daily Ozempic 0.25mg  sq weekly for 4 weeks total (has 3 more week)  Counseled patient on side effects--ate chinese and had GI distress Recommended that patient eat smaller meals and less fat to avoid side effects  Instructed patient to call back in 2 weeks to reports BGs

## 2019-12-06 ENCOUNTER — Telehealth: Payer: Self-pay | Admitting: Pharmacist

## 2019-12-06 ENCOUNTER — Telehealth: Payer: 59 | Admitting: Pharmacist

## 2019-12-06 MED ORDER — FREESTYLE LIBRE 2 SENSOR MISC: 3 refills | Status: DC

## 2019-12-06 MED ORDER — FREESTYLE LIBRE 2 READER DEVI: 0 refills | Status: DC

## 2019-12-06 NOTE — Telephone Encounter (Signed)
BG 177 12pm BG 228 12pm FBG 267 FBG 224 FBG 285 avg overall BG 250  Instructed patient to increase Ozempic to 0.5mg    Feels full! Which is helping diet/diabetes

## 2019-12-13 ENCOUNTER — Telehealth: Payer: Self-pay | Admitting: Family

## 2019-12-13 NOTE — Telephone Encounter (Signed)
BGs ranging from 180-230s Much improved Increase Ozempic to 0.5mg  sq weekly Patient verbalizes understanding

## 2019-12-16 ENCOUNTER — Other Ambulatory Visit: Payer: Self-pay | Admitting: *Deleted

## 2019-12-16 DIAGNOSIS — F320 Major depressive disorder, single episode, mild: Secondary | ICD-10-CM

## 2019-12-16 DIAGNOSIS — F411 Generalized anxiety disorder: Secondary | ICD-10-CM

## 2020-01-20 ENCOUNTER — Other Ambulatory Visit: Payer: Self-pay | Admitting: Family

## 2020-01-23 ENCOUNTER — Telehealth: Payer: Self-pay | Admitting: Family

## 2020-01-23 NOTE — Telephone Encounter (Signed)
Pt called wanting to speak with Raynelle Fanning just to clarify a few questions regarding the Ozempic 0.5mg  she is on.

## 2020-01-24 MED ORDER — ONDANSETRON 4 MG PO TBDP
4.0000 mg | ORAL_TABLET | Freq: Three times a day (TID) | ORAL | 0 refills | Status: DC | PRN
Start: 2020-01-24 — End: 2020-12-22

## 2020-01-24 NOTE — Telephone Encounter (Signed)
Forgot ozempic on Friday.  Took shot on Saturday night.  Instructed patient to take shot tomorrow night and keep it on a qSaturday night rotation Still has nausea in the AM--will have Zofran   BG<200 F/u A1c recheck next week, 01/31/20

## 2020-01-31 ENCOUNTER — Encounter: Payer: Self-pay | Admitting: Family

## 2020-01-31 ENCOUNTER — Other Ambulatory Visit: Payer: Self-pay

## 2020-01-31 ENCOUNTER — Ambulatory Visit (INDEPENDENT_AMBULATORY_CARE_PROVIDER_SITE_OTHER): Payer: 59 | Admitting: Family

## 2020-01-31 VITALS — BP 133/86 | HR 80 | Temp 97.6°F | Ht 64.0 in | Wt 315.0 lb

## 2020-01-31 DIAGNOSIS — Z114 Encounter for screening for human immunodeficiency virus [HIV]: Secondary | ICD-10-CM

## 2020-01-31 DIAGNOSIS — M1A9XX0 Chronic gout, unspecified, without tophus (tophi): Secondary | ICD-10-CM

## 2020-01-31 DIAGNOSIS — Z1159 Encounter for screening for other viral diseases: Secondary | ICD-10-CM

## 2020-01-31 DIAGNOSIS — Z23 Encounter for immunization: Secondary | ICD-10-CM | POA: Diagnosis not present

## 2020-01-31 DIAGNOSIS — Z7189 Other specified counseling: Secondary | ICD-10-CM | POA: Insufficient documentation

## 2020-01-31 DIAGNOSIS — E119 Type 2 diabetes mellitus without complications: Secondary | ICD-10-CM

## 2020-01-31 DIAGNOSIS — F411 Generalized anxiety disorder: Secondary | ICD-10-CM | POA: Insufficient documentation

## 2020-01-31 DIAGNOSIS — E1169 Type 2 diabetes mellitus with other specified complication: Secondary | ICD-10-CM | POA: Insufficient documentation

## 2020-01-31 DIAGNOSIS — E559 Vitamin D deficiency, unspecified: Secondary | ICD-10-CM

## 2020-01-31 DIAGNOSIS — E785 Hyperlipidemia, unspecified: Secondary | ICD-10-CM | POA: Insufficient documentation

## 2020-01-31 DIAGNOSIS — E282 Polycystic ovarian syndrome: Secondary | ICD-10-CM

## 2020-01-31 DIAGNOSIS — K219 Gastro-esophageal reflux disease without esophagitis: Secondary | ICD-10-CM

## 2020-01-31 LAB — BAYER DCA HB A1C WAIVED: HB A1C (BAYER DCA - WAIVED): 8.6 % — ABNORMAL HIGH (ref <7.0)

## 2020-01-31 MED ORDER — OZEMPIC (1 MG/DOSE) 2 MG/1.5ML ~~LOC~~ SOPN: 1.0000 mg | PEN_INJECTOR | SUBCUTANEOUS | 3 refills | Status: DC

## 2020-01-31 MED ORDER — ATORVASTATIN CALCIUM 20 MG PO TABS: 20.0000 mg | ORAL_TABLET | Freq: Every day | ORAL | 3 refills | Status: DC

## 2020-01-31 NOTE — Patient Instructions (Signed)

## 2020-01-31 NOTE — Progress Notes (Signed)
Subjective:    Patient ID: Glenda Mitchell, female    DOB: 29-Oct-1975, 44 y.o.   MRN: [id number]  Chief Complaint  Patient presents with  . Medical Management of Chronic Issues    3 mth, patient is fasting, no concerns. Been having trouble with librea   . Diabetes   PT presents to the office today for chronic follow up. She is followed by GYN annually. She takes provera every 3 months to bring on her menstrual cycle.  Diabetes She presents for her follow-up diabetic visit. She has type 2 diabetes mellitus. Her disease course has been stable. Hypoglycemia symptoms include nervousness/anxiousness. Associated symptoms include foot paresthesias. Pertinent negatives for diabetes include no blurred vision. Symptoms are stable. Diabetic complications include peripheral neuropathy. Pertinent negatives for diabetic complications include no CVA or heart disease. Risk factors for coronary artery disease include dyslipidemia, diabetes mellitus, hypertension and sedentary lifestyle. She is following a generally unhealthy diet. An ACE inhibitor/angiotensin II receptor blocker is being taken. Eye exam is not current.  Gastroesophageal Reflux She complains of belching and heartburn. This is a chronic problem. The current episode started more than 1 year ago. The problem occurs occasionally. The problem has been waxing and waning. She has tried a PPI for the symptoms. The treatment provided moderate relief.  Anxiety Presents for follow-up visit. Symptoms include excessive worry, irritability and nervous/anxious behavior. Symptoms occur occasionally. The severity of symptoms is moderate. The quality of sleep is good.     Gout Pt taking allopurinol daily. States her last gout flare up was years ago.    Review of Systems  Constitutional: Positive for irritability.  Eyes: Negative for blurred vision.  Gastrointestinal: Positive for heartburn.  Psychiatric/Behavioral: The patient is nervous/anxious.   All  other systems reviewed and are negative.      Objective:   Physical Exam Vitals reviewed.  Constitutional:      General: She is not in acute distress.    Appearance: She is well-developed. She is obese.  HENT:     Head: Normocephalic and atraumatic.     Right Ear: Tympanic membrane normal.     Left Ear: Tympanic membrane normal.  Eyes:     Pupils: Pupils are equal, round, and reactive to light.  Neck:     Thyroid: No thyromegaly.  Cardiovascular:     Rate and Rhythm: Normal rate and regular rhythm.     Heart sounds: Normal heart sounds. No murmur heard.   Pulmonary:     Effort: Pulmonary effort is normal. No respiratory distress.     Breath sounds: Normal breath sounds. No wheezing.  Abdominal:     General: Bowel sounds are normal. There is no distension.     Palpations: Abdomen is soft.     Tenderness: There is no abdominal tenderness.  Musculoskeletal:        General: No tenderness. Normal range of motion.     Cervical back: Normal range of motion and neck supple.  Skin:    General: Skin is warm and dry.  Neurological:     Mental Status: She is alert and oriented to person, place, and time.     Cranial Nerves: No cranial nerve deficit.     Deep Tendon Reflexes: Reflexes are normal and symmetric.  Psychiatric:        Behavior: Behavior normal.        Thought Content: Thought content normal.        Judgment: Judgment normal.  Diabetic Foot Exam - Simple   Simple Foot Form Diabetic Foot exam was performed with the following findings: Yes 01/31/2020  9:21 AM  Visual Inspection No deformities, no ulcerations, no other skin breakdown bilaterally: Yes Sensation Testing Intact to touch and monofilament testing bilaterally: Yes Pulse Check Posterior Tibialis and Dorsalis pulse intact bilaterally: Yes Comments      BP 133/86   Pulse 80   Temp 97.6 F (36.4 C) (Temporal)   Ht 5' 4"  (1.626 m)   Wt (!) 315 lb (142.9 kg)   SpO2 97%   BMI 54.07 kg/m        Assessment & Plan:  Glenda Mitchell comes in today with chief complaint of Medical Management of Chronic Issues (3 mth, patient is fasting, no concerns. Been having trouble with librea ) and Diabetes   Diagnosis and orders addressed:  1. Diabetes mellitus without complication ([place]) -Will increase Ozempic to 1 mg from  0.5 mg Strict low carb  Make follow up appt with Clinical Pharm - Bayer DCA Hb A1c Waived - CMP14+EGFR - CBC with Differential/Platelet - Semaglutide, 1 MG/DOSE, (OZEMPIC, 1 MG/DOSE,) 2 MG/1.5ML SOPN; Inject 1 mg into the skin once a week.  Dispense: 1.5 mL; Refill: 3  2. Gastroesophageal reflux disease without esophagitis - CMP14+EGFR - CBC with Differential/Platelet  3. Type 2 diabetes mellitus with other specified complication, without long-term current use of insulin (HCC) - CMP14+EGFR - CBC with Differential/Platelet - Semaglutide, 1 MG/DOSE, (OZEMPIC, 1 MG/DOSE,) 2 MG/1.5ML SOPN; Inject 1 mg into the skin once a week.  Dispense: 1.5 mL; Refill: 3 - Microalbumin / creatinine urine ratio  4. Chronic gout without tophus, unspecified cause, unspecified site - CMP14+EGFR - CBC with Differential/Platelet  5. PCOS (polycystic ovarian syndrome) - CMP14+EGFR - CBC with Differential/Platelet  6. Vitamin D deficiency - CMP14+EGFR - CBC with Differential/Platelet  7. GAD (generalized anxiety disorder) - CMP14+EGFR - CBC with Differential/Platelet  8. Need for immunization against influenza - Flu Vaccine QUAD High Dose(Fluad) - CMP14+EGFR - CBC with Differential/Platelet  9. Educated about COVID-19 virus infection - CMP14+EGFR - CBC with Differential/Platelet  10. Encounter for screening for HIV - CMP14+EGFR - CBC with Differential/Platelet - HIV Antibody (routine testing w rflx)  11. Need for hepatitis C screening test - CMP14+EGFR - CBC with Differential/Platelet - Hepatitis C antibody  12. Hyperlipidemia associated with type 2 diabetes  mellitus (HCC) - Lipid panel - atorvastatin (LIPITOR) 20 MG tablet; Take 1 tablet (20 mg total) by mouth daily.  Dispense: 90 tablet; Refill: 3   Labs pending Health Maintenance reviewed Diet and exercise encouraged  Follow up plan: 2 months    Evelina Dun, FNP

## 2020-01-31 NOTE — Addendum Note (Signed)
Addended by: Austin Miles F on: 01/31/2020 10:03 AM   Modules accepted: Orders

## 2020-02-01 LAB — CMP14+EGFR
ALT: 37 IU/L — ABNORMAL HIGH (ref 0–32)
AST: 41 IU/L — ABNORMAL HIGH (ref 0–40)
Albumin/Globulin Ratio: 1.4 (ref 1.2–2.2)
Albumin: 3.9 g/dL (ref 3.8–4.8)
Alkaline Phosphatase: 62 IU/L (ref 44–121)
BUN/Creatinine Ratio: 27 — ABNORMAL HIGH (ref 9–23)
BUN: 16 mg/dL (ref 6–24)
Bilirubin Total: <0.2 mg/dL (ref 0.0–1.2)
CO2: 24 mmol/L (ref 20–29)
Calcium: 9.5 mg/dL (ref 8.7–10.2)
Chloride: 98 mmol/L (ref 96–106)
Creatinine, Ser: 0.59 mg/dL (ref 0.57–1.00)
GFR calc Af Amer: 131 mL/min/{1.73_m2} (ref >59)
GFR calc non Af Amer: 113 mL/min/{1.73_m2} (ref >59)
Globulin, Total: 2.8 g/dL (ref 1.5–4.5)
Glucose: 188 mg/dL — ABNORMAL HIGH (ref 65–99)
Potassium: 4.7 mmol/L (ref 3.5–5.2)
Sodium: 136 mmol/L (ref 134–144)
Total Protein: 6.7 g/dL (ref 6.0–8.5)

## 2020-02-01 LAB — CBC WITH DIFFERENTIAL/PLATELET
Basophils Absolute: 0 10*3/uL (ref 0.0–0.2)
Basos: 1 %
EOS (ABSOLUTE): 0.2 10*3/uL (ref 0.0–0.4)
Eos: 3 %
Hematocrit: 40.4 % (ref 34.0–46.6)
Hemoglobin: 12.6 g/dL (ref 11.1–15.9)
Immature Grans (Abs): 0 10*3/uL (ref 0.0–0.1)
Immature Granulocytes: 0 %
Lymphocytes Absolute: 1.9 10*3/uL (ref 0.7–3.1)
Lymphs: 26 %
MCH: 27.5 pg (ref 26.6–33.0)
MCHC: 31.2 g/dL — ABNORMAL LOW (ref 31.5–35.7)
MCV: 88 fL (ref 79–97)
Monocytes Absolute: 0.4 10*3/uL (ref 0.1–0.9)
Monocytes: 5 %
Neutrophils Absolute: 4.9 10*3/uL (ref 1.4–7.0)
Neutrophils: 65 %
Platelets: 347 10*3/uL (ref 150–450)
RBC: 4.58 x10E6/uL (ref 3.77–5.28)
RDW: 13.8 % (ref 11.7–15.4)
WBC: 7.5 10*3/uL (ref 3.4–10.8)

## 2020-02-01 LAB — LIPID PANEL
Chol/HDL Ratio: 7.2 ratio — ABNORMAL HIGH (ref 0.0–4.4)
Cholesterol, Total: 231 mg/dL — ABNORMAL HIGH (ref 100–199)
HDL: 32 mg/dL — ABNORMAL LOW (ref >39)
LDL Chol Calc (NIH): 133 mg/dL — ABNORMAL HIGH (ref 0–99)
Triglycerides: 366 mg/dL — ABNORMAL HIGH (ref 0–149)
VLDL Cholesterol Cal: 66 mg/dL — ABNORMAL HIGH (ref 5–40)

## 2020-02-01 LAB — HIV ANTIBODY (ROUTINE TESTING W REFLEX): HIV Screen 4th Generation wRfx: NONREACTIVE

## 2020-02-03 ENCOUNTER — Telehealth: Payer: Self-pay

## 2020-02-03 LAB — HEPATITIS C ANTIBODY: Hep C Virus Ab: <0.1 s/co ratio (ref 0.0–0.9)

## 2020-02-03 LAB — SPECIMEN STATUS REPORT

## 2020-02-03 NOTE — Telephone Encounter (Signed)
Patient reports nausea/vomiting 2 days post Ozempic injection on Saturday A1c 10%-->8.6%  BG on 10/3 158 BG on 10/3 3am 97  BG on 1pm 89, 90  Decrease metformin 500mg  BID  F/u with PharmD in 2 weeks

## 2020-02-07 ENCOUNTER — Telehealth: Payer: Self-pay

## 2020-02-07 NOTE — Telephone Encounter (Signed)
Left message to call back  

## 2020-02-07 NOTE — Telephone Encounter (Signed)
Pt called stating she believes she has a UTI. Explained to pt that I could make her an appt but we dont have any openings right now until next Wednesday. Pt wants to know if there is anything she can take OTC or home remedies to help get her through the weekend?

## 2020-02-12 ENCOUNTER — Ambulatory Visit: Payer: 59 | Admitting: Pharmacist

## 2020-02-24 ENCOUNTER — Ambulatory Visit (INDEPENDENT_AMBULATORY_CARE_PROVIDER_SITE_OTHER): Payer: 59 | Admitting: Pharmacist

## 2020-02-24 ENCOUNTER — Other Ambulatory Visit: Payer: Self-pay

## 2020-02-24 DIAGNOSIS — E119 Type 2 diabetes mellitus without complications: Secondary | ICD-10-CM

## 2020-02-24 NOTE — Progress Notes (Signed)
    02/24/2020 Name: Glenda Mitchell MRN: [id number] DOB: 03/02/1976   S:  23 yoF Presents for diabetes evaluation, education, and management Patient was referred and last seen by Primary Care Provider on 01/31/20.  Insurance coverage/medication affordability: [employer]  Patient reports adherence with medications. . Current diabetes medications include: ozempic 1mg ,metformin  . Current hypertension medications include: losartan Goal 130/80 . Current hyperlipidemia medications include: atorvastatin (new start)  Patient denies hypoglycemic events.   . Patient reported dietary habits: Eats 2-3 meals/day . Snacks: cheese . Drinks:water, diet drinks Discussed meal planning options and Plate method for healthy eating Avoid sugary drinks and desserts Incorporate balanced protein, non starchy veggies, 1 serving of carbohydrate with each meal Increase water intake Increase physical activity as able  Patient-reported exercise habits: reports she will start walking, having difficulty with neuropathy  Patient reports neuropathy (nerve pain).   O:  Lab Results  Component Value Date   HGBA1C 8.6 (H) 01/31/2020    Lipid Panel     Component Value Date/Time   CHOL 231 (H) 01/31/2020 1255   TRIG 366 (H) 01/31/2020 1255   HDL 32 (L) 01/31/2020 1255   CHOLHDL 7.2 (H) 01/31/2020 1255   LDLCALC 133 (H) 01/31/2020 1255    Home fasting blood sugars: 140-160  2 hour post-meal/random blood sugars: 130-246.    A/P:  Diabetes T2DM currently uncontrolled, however patient has improved glycemic control. Patient is adherent with medication. She has had some difficulty with GI side effects from Ozempic.  She reports she tolerated her last dose of Ozempic 1 mg well.  -Continue ozempic 1mg  sq weekly (patient due for injection today).  She prefers to take at night.  -Increase metformin night time dose to 1g.  She is taking 500mg  (1/2 tab) in the mornings.  We have cut back metformin in  the past to attempt to reduce GI adverse events  -Has not started atorvastatin due to ozempic side effects.  Encouraged patient to add in atorvastatin in the next month.  -Extensively discussed pathophysiology of diabetes, recommended lifestyle interventions, dietary effects on blood sugar control  -Counseled on s/sx of and management of hypoglycemia  -Next A1C anticipated 04/02/2020  Written patient instructions provided.  Total time in face to face counseling 30 minutes.   Follow up Pharmacist/PCP Clinic Visit ON 04/02/2020.    , PharmD, BCPS Clinical Pharmacist, [facility]  II Phone [phone]

## 2020-02-25 LAB — MICROALBUMIN / CREATININE URINE RATIO
Creatinine, Urine: 345.3 mg/dL
Microalb/Creat Ratio: 12 mg/g creat (ref 0–29)
Microalbumin, Urine: 40.1 ug/mL

## 2020-03-17 ENCOUNTER — Encounter: Payer: Self-pay | Admitting: Family

## 2020-03-17 ENCOUNTER — Other Ambulatory Visit: Payer: Self-pay

## 2020-03-17 ENCOUNTER — Ambulatory Visit (INDEPENDENT_AMBULATORY_CARE_PROVIDER_SITE_OTHER): Payer: 59 | Admitting: Family

## 2020-03-17 VITALS — BP 129/84 | HR 85 | Temp 97.6°F | Ht 64.0 in | Wt 312.4 lb

## 2020-03-17 DIAGNOSIS — E1169 Type 2 diabetes mellitus with other specified complication: Secondary | ICD-10-CM | POA: Diagnosis not present

## 2020-03-17 DIAGNOSIS — F411 Generalized anxiety disorder: Secondary | ICD-10-CM | POA: Diagnosis not present

## 2020-03-17 DIAGNOSIS — K219 Gastro-esophageal reflux disease without esophagitis: Secondary | ICD-10-CM

## 2020-03-17 DIAGNOSIS — Z0001 Encounter for general adult medical examination with abnormal findings: Secondary | ICD-10-CM

## 2020-03-17 DIAGNOSIS — E282 Polycystic ovarian syndrome: Secondary | ICD-10-CM

## 2020-03-17 DIAGNOSIS — Z0000 Encounter for general adult medical examination without abnormal findings: Secondary | ICD-10-CM

## 2020-03-17 DIAGNOSIS — E785 Hyperlipidemia, unspecified: Secondary | ICD-10-CM

## 2020-03-17 DIAGNOSIS — Z111 Encounter for screening for respiratory tuberculosis: Secondary | ICD-10-CM

## 2020-03-17 DIAGNOSIS — E559 Vitamin D deficiency, unspecified: Secondary | ICD-10-CM

## 2020-03-17 NOTE — Patient Instructions (Signed)

## 2020-03-17 NOTE — Progress Notes (Signed)
Subjective:    Patient ID: Glenda Mitchell, female    DOB: 24-Nov-1975, 44 y.o.   MRN: [id number]  Chief Complaint  Patient presents with  . Annual Exam    for for DSS   PT presents to the office today for CPE without pap. She is followed by GYN annually for PCOS. She takes provera every 3 months to bring on her menstrual cycle.  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  Gastroesophageal Reflux She complains of belching, heartburn and a hoarse voice. This is a chronic problem. The current episode started more than 1 year ago. The problem occurs occasionally. The problem has been waxing and waning. She has tried a PPI for the symptoms. The treatment provided moderate relief.  Hyperlipidemia This is a chronic problem. The current episode started more than 1 year ago. The problem is uncontrolled. Recent lipid tests were reviewed and are high. Exacerbating diseases include obesity. Current antihyperlipidemic treatment includes statins. The current treatment provides moderate improvement of lipids. Risk factors for coronary artery disease include dyslipidemia, diabetes mellitus, hypertension, a sedentary lifestyle and post-menopausal.  Diabetes She presents for her follow-up diabetic visit. She has type 2 diabetes mellitus. Her disease course has been stable. Hypoglycemia symptoms include nervousness/anxiousness. Pertinent negatives for diabetes include no blurred vision and no foot paresthesias. Symptoms are stable. Diabetic complications include peripheral neuropathy. Pertinent negatives for diabetic complications include no heart disease or nephropathy. Risk factors for coronary artery disease include dyslipidemia, diabetes mellitus, hypertension, sedentary lifestyle and post-menopausal. She is following a generally unhealthy diet. Her overall blood glucose range is 130-140 mg/dl. An ACE inhibitor/angiotensin II receptor blocker is being taken.  Anxiety Presents for  follow-up visit. Symptoms include depressed mood, excessive worry, irritability, nervous/anxious behavior and restlessness. Symptoms occur most days. The severity of symptoms is moderate.        Review of Systems  Constitutional: Positive for irritability.  HENT: Positive for hoarse voice.   Eyes: Negative for blurred vision.  Gastrointestinal: Positive for heartburn.  Psychiatric/Behavioral: The patient is nervous/anxious.   All other systems reviewed and are negative.  Family History  Problem Relation Age of Onset  . Diabetes Mother   . Cancer Mother        breast/lung  . Heart attack Father   . Hypertension Father   . Diabetes Sister   . Heart disease Sister   . Cancer Maternal Grandmother        lung  . Kidney disease Sister    Social History   Socioeconomic History  . Marital status: Married    Spouse name: Not on file  . Number of children: 3  . Years of education: Not on file  . Highest education level: Not on file  Occupational History  . Not on file  Tobacco Use  . Smoking status: Never Smoker  . Smokeless tobacco: Never Used  Vaping Use  . Vaping Use: Never used  Substance and Sexual Activity  . Alcohol use: Never  . Drug use: Never  . Sexual activity: Yes    Birth control/protection: None  Other Topics Concern  . Not on file  Social History Narrative  . Not on file   Social Determinants of Health   Financial Resource Strain:   . Difficulty of Paying Living Expenses: Not on file  Food Insecurity:   . Worried About Programme researcher, broadcasting/film/video in the Last Year: Not on file  . Ran Out of Food  in the Last Year: Not on file  Transportation Needs:   . Lack of Transportation (Medical): Not on file  . Lack of Transportation (Non-Medical): Not on file  Physical Activity:   . Days of Exercise per Week: Not on file  . Minutes of Exercise per Session: Not on file  Stress:   . Feeling of Stress : Not on file  Social Connections:   . Frequency of Communication  with Friends and Family: Not on file  . Frequency of Social Gatherings with Friends and Family: Not on file  . Attends Religious Services: Not on file  . Active Member of Clubs or Organizations: Not on file  . Attends Banker Meetings: Not on file  . Marital Status: Not on file        Objective:   Physical Exam Vitals reviewed.  Constitutional:      General: She is not in acute distress.    Appearance: She is well-developed. She is obese.  HENT:     Head: Normocephalic and atraumatic.     Right Ear: Tympanic membrane normal.     Left Ear: Tympanic membrane normal.  Eyes:     Pupils: Pupils are equal, round, and reactive to light.  Neck:     Thyroid: No thyromegaly.  Cardiovascular:     Rate and Rhythm: Normal rate and regular rhythm.     Heart sounds: Normal heart sounds. No murmur heard.   Pulmonary:     Effort: Pulmonary effort is normal. No respiratory distress.     Breath sounds: Normal breath sounds. No wheezing.  Abdominal:     General: Bowel sounds are normal. There is no distension.     Palpations: Abdomen is soft.     Tenderness: There is no abdominal tenderness.  Musculoskeletal:        General: No tenderness. Normal range of motion.     Cervical back: Normal range of motion and neck supple.  Skin:    General: Skin is warm and dry.  Neurological:     Mental Status: She is alert and oriented to person, place, and time.     Cranial Nerves: No cranial nerve deficit.     Deep Tendon Reflexes: Reflexes are normal and symmetric.  Psychiatric:        Behavior: Behavior normal.        Thought Content: Thought content normal.        Judgment: Judgment normal.          BP 129/84   Pulse 85   Temp 97.6 F (36.4 C) (Temporal)   Ht 5\' 4"  (1.626 m)   Wt (!) 312 lb 6.4 oz (141.7 kg)   BMI 53.62 kg/m   Assessment & Plan:  Glenda Mitchell comes in today with chief complaint of Annual Exam (for for DSS)   Diagnosis and orders addressed:  1.  Annual physical exam - PPD  2. Gastroesophageal reflux disease without esophagitis  3. Type 2 diabetes mellitus with other specified complication, without long-term current use of insulin (HCC)  4. GAD (generalized anxiety disorder)  5. PCOS (polycystic ovarian syndrome)  6. Hyperlipidemia associated with type 2 diabetes mellitus (HCC  7. Vitamin D deficiency    Labs pending Health Maintenance reviewed Diet and exercise encouraged  Follow up plan: 2 months   Ardeth Perfect, FNP

## 2020-03-19 LAB — TB SKIN TEST
Induration: 0 mm
TB Skin Test: NEGATIVE

## 2020-03-20 ENCOUNTER — Other Ambulatory Visit: Payer: Self-pay | Admitting: Family

## 2020-03-23 ENCOUNTER — Other Ambulatory Visit: Payer: Self-pay | Admitting: Family

## 2020-03-29 ENCOUNTER — Other Ambulatory Visit: Payer: Self-pay | Admitting: Family

## 2020-03-29 DIAGNOSIS — E559 Vitamin D deficiency, unspecified: Secondary | ICD-10-CM

## 2020-04-02 ENCOUNTER — Ambulatory Visit: Payer: 59 | Admitting: Family

## 2020-04-08 ENCOUNTER — Telehealth: Payer: Self-pay

## 2020-04-08 NOTE — Telephone Encounter (Signed)
Returned call to patient regarding ozempic injection technique Patient verbalizes understanding

## 2020-04-13 ENCOUNTER — Telehealth: Payer: Self-pay

## 2020-04-13 NOTE — Telephone Encounter (Signed)
Attempted to contact patient - NA Per rx refusal reason is- may take OTC 1000-2000u daily

## 2020-04-20 ENCOUNTER — Ambulatory Visit: Payer: 59 | Admitting: Family Medicine

## 2020-04-21 ENCOUNTER — Encounter: Payer: Self-pay | Admitting: Family

## 2020-04-23 NOTE — Telephone Encounter (Signed)
Multiple attempts made to contact patient.  This encounter will now be closed  

## 2020-05-13 ENCOUNTER — Encounter: Payer: Self-pay | Admitting: Emergency Medicine

## 2020-05-13 ENCOUNTER — Ambulatory Visit (INDEPENDENT_AMBULATORY_CARE_PROVIDER_SITE_OTHER): Payer: 59

## 2020-05-13 ENCOUNTER — Other Ambulatory Visit: Payer: Self-pay

## 2020-05-13 ENCOUNTER — Ambulatory Visit
Admission: EM | Admit: 2020-05-13 | Discharge: 2020-05-13 | Disposition: A | Discharge status: Home / Self Care | Payer: 59 | Attending: Family Medicine | Admitting: Family Medicine

## 2020-05-13 DIAGNOSIS — R0602 Shortness of breath: Secondary | ICD-10-CM

## 2020-05-13 DIAGNOSIS — R059 Cough, unspecified: Secondary | ICD-10-CM | POA: Diagnosis not present

## 2020-05-13 DIAGNOSIS — R0682 Tachypnea, not elsewhere classified: Secondary | ICD-10-CM

## 2020-05-13 DIAGNOSIS — R5383 Other fatigue: Secondary | ICD-10-CM

## 2020-05-13 DIAGNOSIS — U071 COVID-19: Secondary | ICD-10-CM | POA: Diagnosis not present

## 2020-05-13 DIAGNOSIS — R509 Fever, unspecified: Secondary | ICD-10-CM

## 2020-05-13 DIAGNOSIS — R062 Wheezing: Secondary | ICD-10-CM

## 2020-05-13 HISTORY — DX: Essential (primary) hypertension: I10

## 2020-05-13 MED ORDER — PREDNISONE 10 MG (21) PO TBPK: ORAL_TABLET | Freq: Every day | ORAL | 0 refills | Status: AC

## 2020-05-13 MED ORDER — BENZONATATE 100 MG PO CAPS: 100.0000 mg | ORAL_CAPSULE | Freq: Three times a day (TID) | ORAL | 0 refills | Status: DC

## 2020-05-13 NOTE — ED Triage Notes (Signed)
Pt presents today with c/o of fever. She was tested on 05/08/20 for Covid and received positive results today.

## 2020-05-13 NOTE — Discharge Instructions (Addendum)
Chest xray today  I have sent in a prednisone taper for you to take for 6 days. 6 tablets on day one, 5 tablets on day two, 4 tablets on day three, 3 tablets on day four, 2 tablets on day five, and 1 tablet on day six.  I have sent in tessalon perles for you to use one capsule every 8 hours as needed for cough.  Follow up with this office or with primary care if symptoms are persisting.  Follow up in the ER for high fever, trouble swallowing, trouble breathing, other concerning symptoms.

## 2020-05-13 NOTE — ED Provider Notes (Signed)
[place]   [id number] 05/13/20 Arrival Time: 9480   CC: COVID symptoms  SUBJECTIVE: History from: patient.  Glenda Mitchell is a 45 y.o. female who presents with + Covid, fevverm cough, fatigue, SOB for the last 3-4 days. Reports that her husband and son has Covid as well. Denies recent travel. Has not completed Covid vaccines. Has not taken OTC medications for this. There are no aggravating or alleviating factors. Denies previous symptoms in the past. Denies fever, chills, fatigue, sinus pain, rhinorrhea, sore throat, SOB, wheezing, chest pain, nausea, changes in bowel or bladder habits.    ROS: As per HPI.  All other pertinent ROS negative.     Past Medical History:  Diagnosis Date  . Diabetes mellitus without complication ([place])   . Gout   . Hypertension    Past Surgical History:  Procedure Laterality Date  . foot surgery, right     Allergies  Allergen Reactions  . Clarithromycin Other (See Comments)    Chest pain and SOB  . Ivp Dye [Iodinated Diagnostic Agents]     Lip swelling when receiving dye for procedure  . Jardiance [Empagliflozin]     Yeast infection    No current facility-administered medications on file prior to encounter.   Current Outpatient Medications on File Prior to Encounter  Medication Sig Dispense Refill  . allopurinol (ZYLOPRIM) 100 MG tablet Take 2 tablets (200 mg total) by mouth daily. 60 tablet 5  . atorvastatin (LIPITOR) 20 MG tablet Take 1 tablet (20 mg total) by mouth daily. 90 tablet 3  . cetirizine (ZYRTEC) 10 MG tablet Take 1 tablet (10 mg total) by mouth daily as needed. 30 tablet 11  . Diclofenac Sodium CR 100 MG 24 hr tablet Take 1 tablet (100 mg total) by mouth daily. 60 tablet 5  . escitalopram (LEXAPRO) 10 MG tablet Take 1 tablet (10 mg total) by mouth daily. 30 tablet 5  . esomeprazole (NEXIUM) 40 MG capsule Take 1 capsule (40 mg total) by mouth daily. 30 capsule 11  . metFORMIN (GLUCOPHAGE) 1000 MG tablet Take 1  tablet (1,000 mg total) by mouth 2 (two) times daily. 60 tablet 5  . Semaglutide, 1 MG/DOSE, (OZEMPIC, 1 MG/DOSE,) 2 MG/1.5ML SOPN Inject 1 mg into the skin once a week. 1.5 mL 3  . Vitamin D, Ergocalciferol, (DRISDOL) 1.25 MG (50000 UNIT) CAPS capsule Take 1 capsule (50,000 Units total) by mouth 2 (two) times a week. 10 capsule 3  . Blood Glucose Monitoring Suppl (ONETOUCH VERIO) w/Device KIT 1 kit by Does not apply route 3 (three) times daily before meals. 1 kit 11  . Continuous Blood Gluc Receiver (FREESTYLE LIBRE 2 READER) DEVI USE TO TEST BLOOD SUGAR UP TO SIX TIMES DAILY 2 each 2  . Continuous Blood Gluc Sensor (FREESTYLE LIBRE 2 SENSOR) MISC TEST BLOOD SUGAR UP TO 6 TIMES DAILY Dx E11.9 2 each 4  . fluticasone (FLONASE) 50 MCG/ACT nasal spray Place 2 sprays into both nostrils daily. 16 g 6  . glucose blood (ONETOUCH VERIO) test strip Test BS daily Dx E11.9 100 each 3  . Lancets (ONETOUCH ULTRASOFT) lancets Use as instructed 100 each 12  . losartan (COZAAR) 50 MG tablet Take 1 tablet (50 mg total) by mouth daily. 90 tablet 3  . medroxyPROGESTERone (PROVERA) 10 MG tablet TAKE 1 TABLET BY MOUTH ONCE DAILY FOR 10 DAYS EVERY 3 MONTHS    . ondansetron (ZOFRAN ODT) 4 MG disintegrating tablet Take 1 tablet (4 mg total) by  mouth every 8 (eight) hours as needed for nausea or vomiting. 20 tablet 0   Social History   Socioeconomic History  . Marital status: Married    Spouse name: Not on file  . Number of children: 3  . Years of education: Not on file  . Highest education level: Not on file  Occupational History  . Not on file  Tobacco Use  . Smoking status: Never Smoker  . Smokeless tobacco: Never Used  Vaping Use  . Vaping Use: Never used  Substance and Sexual Activity  . Alcohol use: Never  . Drug use: Never  . Sexual activity: Yes    Birth control/protection: None  Other Topics Concern  . Not on file  Social History Narrative  . Not on file   Social Determinants of Health    Financial Resource Strain: Not on file  Food Insecurity: Not on file  Transportation Needs: Not on file  Physical Activity: Not on file  Stress: Not on file  Social Connections: Not on file  Intimate Partner Violence: Not on file   Family History  Problem Relation Age of Onset  . Diabetes Mother   . Cancer Mother        breast/lung  . Heart attack Father   . Hypertension Father   . Diabetes Sister   . Heart disease Sister   . Cancer Maternal Grandmother        lung  . Kidney disease Sister     OBJECTIVE:  Vitals:   05/13/20 1821  BP: 129/82  Pulse: 96  Resp: (!) 22  Temp: (!) 100.7 F (38.2 C)  TempSrc: Oral  SpO2: 95%     General appearance: alert; appears fatigued, but nontoxic; speaking in full sentences and tolerating own secretions, febrile in office today HEENT: NCAT; Ears: EACs clear, TMs pearly gray; Eyes: PERRL.  EOM grossly intact. Sinuses: nontender; Nose: nares patent with clear rhinorrhea, Throat: oropharynx erythematous, cobblestoning present, tonsils non erythematous or enlarged, uvula midline  Neck: supple without LAD Lungs: unlabored respirations, symmetrical air entry; cough: mild; no respiratory distress; wheezing, coarse lung sounds and diminished in bilateral bases Heart: regular rate and rhythm. Radial pulses 2+ symmetrical bilaterally Skin: warm and dry Psychological: alert and cooperative; normal mood and affect  LABS:  No results found for this or any previous visit (from the past 24 hour(s)).   ASSESSMENT & PLAN:  1. COVID-19   2. Fever, unspecified fever cause   3. Cough   4. Other fatigue   5. SOB (shortness of breath)   6. Tachypnea     Meds ordered this encounter  Medications  . predniSONE (STERAPRED UNI-PAK 21 TAB) 10 MG (21) TBPK tablet    Sig: Take by mouth daily for 6 days. Take 6 tablets on day 1, 5 tablets on day 2, 4 tablets on day 3, 3 tablets on day 4, 2 tablets on day 5, 1 tablet on day 6    Dispense:  21 tablet     Refill:  0    Order Specific Question:   Supervising Provider    Answer:   Chase Picket [id number]  . benzonatate (TESSALON) 100 MG capsule    Sig: Take 1 capsule (100 mg total) by mouth every 8 (eight) hours.    Dispense:  21 capsule    Refill:  0    Order Specific Question:   Supervising Provider    Answer:   Chase Picket [id number]   Chest xray negative  for pneumonia today Prescribed steroid taper Prescribed Tessalon Perles Continue supportive care at home COVID and flu testing ordered.  It will take between 2-3 days for test results. Someone will contact you regarding abnormal results.   Work note provided Patient should remain in quarantine until they have received Covid results.  If negative you may resume normal activities (go back to work/school) while practicing hand hygiene, social distance, and mask wearing.  If positive, patient should remain in quarantine for at least 5 days from symptom onset AND greater than 72 hours after symptoms resolution (absence of fever without the use of fever-reducing medication and improvement in respiratory symptoms), whichever is longer Get plenty of rest and push fluids Use OTC zyrtec for nasal congestion, runny nose, and/or sore throat Use OTC flonase for nasal congestion and runny nose Use medications daily for symptom relief Use OTC medications like ibuprofen or tylenol as needed fever or pain Call or go to the ED if you have any new or worsening symptoms such as fever, worsening cough, shortness of breath, chest tightness, chest pain, turning blue, changes in mental status.  Reviewed expectations re: course of current medical issues. Questions answered. Outlined signs and symptoms indicating need for more acute intervention. Patient verbalized understanding. After Visit Summary given.         Faustino Congress, NP 05/13/20 1850

## 2020-05-15 ENCOUNTER — Telehealth: Payer: Self-pay

## 2020-05-15 NOTE — Telephone Encounter (Addendum)
Patient aware, she is still experiencing some tightness in her chest and will continue the Prednisone.

## 2020-05-15 NOTE — Telephone Encounter (Signed)
She can stop the prednisone if her SOB is stable. If she is still having SOB and chest tightness this should help. Strict low carb diet and make sure you are taking your metformin and Ozempic

## 2020-06-10 ENCOUNTER — Other Ambulatory Visit: Payer: Self-pay | Admitting: Family

## 2020-06-10 DIAGNOSIS — F320 Major depressive disorder, single episode, mild: Secondary | ICD-10-CM

## 2020-06-10 DIAGNOSIS — F411 Generalized anxiety disorder: Secondary | ICD-10-CM

## 2020-06-12 ENCOUNTER — Other Ambulatory Visit: Payer: Self-pay | Admitting: *Deleted

## 2020-06-12 DIAGNOSIS — K219 Gastro-esophageal reflux disease without esophagitis: Secondary | ICD-10-CM

## 2020-06-12 MED ORDER — ESOMEPRAZOLE MAGNESIUM 40 MG PO CPDR: 40.0000 mg | DELAYED_RELEASE_CAPSULE | Freq: Every day | ORAL | 0 refills | Status: DC

## 2020-06-16 ENCOUNTER — Other Ambulatory Visit: Payer: Self-pay | Admitting: Family

## 2020-06-16 DIAGNOSIS — E119 Type 2 diabetes mellitus without complications: Secondary | ICD-10-CM

## 2020-06-16 DIAGNOSIS — E1169 Type 2 diabetes mellitus with other specified complication: Secondary | ICD-10-CM

## 2020-06-18 ENCOUNTER — Ambulatory Visit: Payer: 59 | Admitting: Family

## 2020-06-19 ENCOUNTER — Other Ambulatory Visit: Payer: Self-pay | Admitting: Family

## 2020-06-19 DIAGNOSIS — F411 Generalized anxiety disorder: Secondary | ICD-10-CM

## 2020-06-19 DIAGNOSIS — F320 Major depressive disorder, single episode, mild: Secondary | ICD-10-CM

## 2020-06-23 ENCOUNTER — Other Ambulatory Visit: Payer: Self-pay | Admitting: *Deleted

## 2020-06-23 MED ORDER — CETIRIZINE HCL 10 MG PO TABS
10.0000 mg | ORAL_TABLET | Freq: Every day | ORAL | 1 refills | Status: DC | PRN
Start: 2020-06-23 — End: 2021-01-22

## 2020-07-03 ENCOUNTER — Other Ambulatory Visit: Payer: Self-pay | Admitting: Family

## 2020-07-03 DIAGNOSIS — K219 Gastro-esophageal reflux disease without esophagitis: Secondary | ICD-10-CM

## 2020-07-07 ENCOUNTER — Other Ambulatory Visit: Payer: Self-pay

## 2020-07-07 ENCOUNTER — Ambulatory Visit (INDEPENDENT_AMBULATORY_CARE_PROVIDER_SITE_OTHER): Payer: 59 | Admitting: Family

## 2020-07-07 ENCOUNTER — Encounter: Payer: Self-pay | Admitting: Family

## 2020-07-07 VITALS — BP 124/78 | HR 81 | Temp 98.2°F | Ht 64.0 in | Wt 309.4 lb

## 2020-07-07 DIAGNOSIS — E1169 Type 2 diabetes mellitus with other specified complication: Secondary | ICD-10-CM

## 2020-07-07 DIAGNOSIS — F320 Major depressive disorder, single episode, mild: Secondary | ICD-10-CM

## 2020-07-07 DIAGNOSIS — M1A9XX0 Chronic gout, unspecified, without tophus (tophi): Secondary | ICD-10-CM

## 2020-07-07 DIAGNOSIS — E282 Polycystic ovarian syndrome: Secondary | ICD-10-CM | POA: Diagnosis not present

## 2020-07-07 DIAGNOSIS — F411 Generalized anxiety disorder: Secondary | ICD-10-CM | POA: Diagnosis not present

## 2020-07-07 DIAGNOSIS — E559 Vitamin D deficiency, unspecified: Secondary | ICD-10-CM

## 2020-07-07 DIAGNOSIS — K219 Gastro-esophageal reflux disease without esophagitis: Secondary | ICD-10-CM

## 2020-07-07 DIAGNOSIS — E119 Type 2 diabetes mellitus without complications: Secondary | ICD-10-CM

## 2020-07-07 DIAGNOSIS — J301 Allergic rhinitis due to pollen: Secondary | ICD-10-CM

## 2020-07-07 DIAGNOSIS — I10 Essential (primary) hypertension: Secondary | ICD-10-CM

## 2020-07-07 DIAGNOSIS — E785 Hyperlipidemia, unspecified: Secondary | ICD-10-CM

## 2020-07-07 LAB — BAYER DCA HB A1C WAIVED: HB A1C (BAYER DCA - WAIVED): 8.1 % — ABNORMAL HIGH (ref <7.0)

## 2020-07-07 LAB — HM DIABETES EYE EXAM

## 2020-07-07 MED ORDER — ATORVASTATIN CALCIUM 20 MG PO TABS: 20.0000 mg | ORAL_TABLET | Freq: Every day | ORAL | 3 refills | Status: DC

## 2020-07-07 MED ORDER — METFORMIN HCL 1000 MG PO TABS
1000.0000 mg | ORAL_TABLET | Freq: Two times a day (BID) | ORAL | 5 refills | Status: DC
Start: 2020-07-07 — End: 2021-01-22

## 2020-07-07 MED ORDER — FLUTICASONE PROPIONATE 50 MCG/ACT NA SUSP: 2.0000 | Freq: Every day | NASAL | 6 refills | Status: DC

## 2020-07-07 MED ORDER — ESOMEPRAZOLE MAGNESIUM 40 MG PO CPDR: 40.0000 mg | DELAYED_RELEASE_CAPSULE | Freq: Every day | ORAL | 2 refills | Status: DC

## 2020-07-07 MED ORDER — DICLOFENAC SODIUM ER 100 MG PO TB24: 100.0000 mg | ORAL_TABLET | Freq: Every day | ORAL | 5 refills | Status: DC

## 2020-07-07 MED ORDER — ESCITALOPRAM OXALATE 10 MG PO TABS: 10.0000 mg | ORAL_TABLET | Freq: Every day | ORAL | 3 refills | Status: DC

## 2020-07-07 MED ORDER — LOSARTAN POTASSIUM 50 MG PO TABS
50.0000 mg | ORAL_TABLET | Freq: Every day | ORAL | 3 refills | Status: DC
Start: 2020-07-07 — End: 2021-07-07

## 2020-07-07 MED ORDER — ALLOPURINOL 100 MG PO TABS: 200.0000 mg | ORAL_TABLET | Freq: Every day | ORAL | 5 refills | Status: DC

## 2020-07-07 MED ORDER — OZEMPIC (1 MG/DOSE) 4 MG/3ML ~~LOC~~ SOPN: PEN_INJECTOR | SUBCUTANEOUS | 2 refills | Status: DC

## 2020-07-07 NOTE — Progress Notes (Signed)
Subjective:    Patient ID: Glenda Mitchell, female    DOB: 09-Feb-1976, 45 y.o.   MRN: [id number]  Chief Complaint  Patient presents with  . Diabetes    3 mth     PT presents to the office todayfor chronic follow up. She is followed by GYN annually for PCOS. She takes provera every 3 months to bring on her menstrual cycle.  Diabetes She presents for her follow-up diabetic visit. She has type 2 diabetes mellitus. Her disease course has been stable. Associated symptoms include blurred vision and foot paresthesias. Pertinent negatives for diabetes include no foot ulcerations. There are no hypoglycemic complications. Symptoms are stable. Diabetic complications include peripheral neuropathy. Pertinent negatives for diabetic complications include no CVA, heart disease or nephropathy. Risk factors for coronary artery disease include diabetes mellitus, dyslipidemia, hypertension, post-menopausal and sedentary lifestyle. She is following a generally unhealthy diet. Her overall blood glucose range is 140-180 mg/dl. (Elevated with steroids for COVID ) An ACE inhibitor/angiotensin II receptor blocker is being taken. Eye exam is current.  Gastroesophageal Reflux She complains of belching and heartburn. She reports no globus sensation. This is a chronic problem. The current episode started more than 1 year ago. The problem occurs frequently. The symptoms are aggravated by certain foods. Risk factors include obesity. She has tried a PPI for the symptoms. The treatment provided moderate relief.  Hyperlipidemia This is a chronic problem. The current episode started more than 1 year ago. The problem is controlled. Recent lipid tests were reviewed and are normal. The current treatment provides moderate improvement of lipids. Risk factors for coronary artery disease include dyslipidemia, diabetes mellitus, hypertension and a sedentary lifestyle.  Anxiety Presents for follow-up visit. Symptoms include depressed  mood, irritability and restlessness. Patient reports no excessive worry. Symptoms occur occasionally. The severity of symptoms is moderate. The quality of sleep is good.        Review of Systems  Constitutional: Positive for irritability.  Eyes: Positive for blurred vision.  Gastrointestinal: Positive for heartburn.  All other systems reviewed and are negative.      Objective:   Physical Exam Vitals reviewed.  Constitutional:      General: She is not in acute distress.    Appearance: She is well-developed and well-nourished. She is obese.  HENT:     Head: Normocephalic and atraumatic.     Right Ear: Tympanic membrane normal.     Left Ear: Tympanic membrane normal.     Mouth/Throat:     Mouth: Oropharynx is clear and moist.  Eyes:     Pupils: Pupils are equal, round, and reactive to light.  Neck:     Thyroid: No thyromegaly.  Cardiovascular:     Rate and Rhythm: Normal rate and regular rhythm.     Pulses: Intact distal pulses.     Heart sounds: Normal heart sounds. No murmur heard.   Pulmonary:     Effort: Pulmonary effort is normal. No respiratory distress.     Breath sounds: Normal breath sounds. No wheezing.  Abdominal:     General: Bowel sounds are normal. There is no distension.     Palpations: Abdomen is soft.     Tenderness: There is no abdominal tenderness.  Musculoskeletal:        General: No tenderness or edema. Normal range of motion.     Cervical back: Normal range of motion and neck supple.  Skin:    General: Skin is warm and dry.  Neurological:  Mental Status: She is alert and oriented to person, place, and time.     Cranial Nerves: No cranial nerve deficit.     Deep Tendon Reflexes: Reflexes are normal and symmetric.  Psychiatric:        Mood and Affect: Mood and affect normal.        Behavior: Behavior normal.        Thought Content: Thought content normal.        Judgment: Judgment normal.       BP 124/78   Pulse 81   Temp 98.2 F  (36.8 C) (Temporal)   Ht 5' 4"  (1.626 m)   Wt (!) 309 lb 6.4 oz (140.3 kg)   BMI 53.11 kg/m      Assessment & Plan:  Glenda Mitchell comes in today with chief complaint of Diabetes (3 mth )   Diagnosis and orders addressed:  1. Chronic gout without tophus, unspecified cause, unspecified site - Diclofenac Sodium CR 100 MG 24 hr tablet; Take 1 tablet (100 mg total) by mouth daily.  Dispense: 60 tablet; Refill: 5 - allopurinol (ZYLOPRIM) 100 MG tablet; Take 2 tablets (200 mg total) by mouth daily.  Dispense: 60 tablet; Refill: 5 - CMP14+EGFR - CBC with Differential/Platelet  2. GAD (generalized anxiety disorder) - escitalopram (LEXAPRO) 10 MG tablet; Take 1 tablet (10 mg total) by mouth daily.  Dispense: 90 tablet; Refill: 3 - CMP14+EGFR - CBC with Differential/Platelet  3. Depression, major, single episode, mild (HCC) - escitalopram (LEXAPRO) 10 MG tablet; Take 1 tablet (10 mg total) by mouth daily.  Dispense: 90 tablet; Refill: 3 - CMP14+EGFR - CBC with Differential/Platelet  4. Diabetes mellitus without complication ([facility]CC) - metFORMIN (GLUCOPHAGE) 1000 MG tablet; Take 1 tablet (1,000 mg total) by mouth 2 (two) times daily.  Dispense: 60 tablet; Refill: 5 - Semaglutide, 1 MG/DOSE, (OZEMPIC, 1 MG/DOSE,) 4 MG/3ML SOPN; INJECT 1 MG SUBCUTANEOUSLY ONCE A WEEK  Dispense: 3 mL; Refill: 2 - Bayer DCA Hb A1c Waived - CMP14+EGFR - CBC with Differential/Platelet  5. Essential hypertension - losartan (COZAAR) 50 MG tablet; Take 1 tablet (50 mg total) by mouth daily.  Dispense: 90 tablet; Refill: 3 - CMP14+EGFR - CBC with Differential/Platelet  6. Hyperlipidemia associated with type 2 diabetes mellitus (HCC) - atorvastatin (LIPITOR) 20 MG tablet; Take 1 tablet (20 mg total) by mouth daily.  Dispense: 90 tablet; Refill: 3 - CMP14+EGFR - CBC with Differential/Platelet   8. Gastroesophageal reflux disease without esophagitis - esomeprazole (NEXIUM) 40 MG capsule; Take 1 capsule (40  mg total) by mouth daily.  Dispense: 90 capsule; Refill: 2 - CMP14+EGFR - CBC with Differential/Platelet  9. Type 2 diabetes mellitus with other specified complication, without long-term current use of insulin (HCC)  - Semaglutide, 1 MG/DOSE, (OZEMPIC, 1 MG/DOSE,) 4 MG/3ML SOPN; INJECT 1 MG SUBCUTANEOUSLY ONCE A WEEK  Dispense: 3 mL; Refill: 2 - CMP14+EGFR - CBC with Differential/Platelet  10. PCOS (polycystic ovarian syndrome) - CMP14+EGFR - CBC with Differential/Platelet  11. Vitamin D deficiency - CMP14+EGF - CBC with Differential/Platelet   Labs pending Health Maintenance reviewed Diet and exercise encouraged  Follow up plan: 4 months    Evelina Dun, FNP

## 2020-07-07 NOTE — Patient Instructions (Signed)
Diabetes Mellitus and Nutrition, Adult When you have diabetes, or diabetes mellitus, it is very important to have healthy eating habits because your blood sugar (glucose) levels are greatly affected by what you eat and drink. Eating healthy foods in the right amounts, at about the same times every day, can help you:  Control your blood glucose.  Lower your risk of heart disease.  Improve your blood pressure.  Reach or maintain a healthy weight. What can affect my meal plan? Every person with diabetes is different, and each person has different needs for a meal plan. Your health care provider may recommend that you work with a dietitian to make a meal plan that is best for you. Your meal plan may vary depending on factors such as:  The calories you need.  The medicines you take.  Your weight.  Your blood glucose, blood pressure, and cholesterol levels.  Your activity level.  Other health conditions you have, such as heart or kidney disease. How do carbohydrates affect me? Carbohydrates, also called carbs, affect your blood glucose level more than any other type of food. Eating carbs naturally raises the amount of glucose in your blood. Carb counting is a method for keeping track of how many carbs you eat. Counting carbs is important to keep your blood glucose at a healthy level, especially if you use insulin or take certain oral diabetes medicines. It is important to know how many carbs you can safely have in each meal. This is different for every person. Your dietitian can help you calculate how many carbs you should have at each meal and for each snack. How does alcohol affect me? Alcohol can cause a sudden decrease in blood glucose (hypoglycemia), especially if you use insulin or take certain oral diabetes medicines. Hypoglycemia can be a life-threatening condition. Symptoms of hypoglycemia, such as sleepiness, dizziness, and confusion, are similar to symptoms of having too much  alcohol.  Do not drink alcohol if: ? Your health care provider tells you not to drink. ? You are pregnant, may be pregnant, or are planning to become pregnant.  If you drink alcohol: ? Do not drink on an empty stomach. ? Limit how much you use to:  0-1 drink a day for women.  0-2 drinks a day for men. ? Be aware of how much alcohol is in your drink. In the [place] one drink equals one 12 oz bottle of beer (355 mL), one 5 oz glass of wine (148 mL), or one 1 oz glass of hard liquor (44 mL). ? Keep yourself hydrated with water, diet soda, or unsweetened iced tea.  Keep in mind that regular soda, juice, and other mixers may contain a lot of sugar and must be counted as carbs. What are tips for following this plan? Reading food labels  Start by checking the serving size on the "Nutrition Facts" label of packaged foods and drinks. The amount of calories, carbs, fats, and other nutrients listed on the label is based on one serving of the item. Many items contain more than one serving per package.  Check the total grams (g) of carbs in one serving. You can calculate the number of servings of carbs in one serving by dividing the total carbs by 15. For example, if a food has 30 g of total carbs per serving, it would be equal to 2 servings of carbs.  Check the number of grams (g) of saturated fats and trans fats in one serving. Choose foods that have   a low amount or none of these fats.  Check the number of milligrams (mg) of salt (sodium) in one serving. Most people should limit total sodium intake to less than 2,300 mg per day.  Always check the nutrition information of foods labeled as "low-fat" or "nonfat." These foods may be higher in added sugar or refined carbs and should be avoided.  Talk to your dietitian to identify your daily goals for nutrients listed on the label. Shopping  Avoid buying canned, pre-made, or processed foods. These foods tend to be high in fat, sodium, and added  sugar.  Shop around the outside edge of the grocery store. This is where you will most often find fresh fruits and vegetables, bulk grains, fresh meats, and fresh dairy. Cooking  Use low-heat cooking methods, such as baking, instead of high-heat cooking methods like deep frying.  Cook using healthy oils, such as olive, canola, or sunflower oil.  Avoid cooking with butter, cream, or high-fat meats. Meal planning  Eat meals and snacks regularly, preferably at the same times every day. Avoid going long periods of time without eating.  Eat foods that are high in fiber, such as fresh fruits, vegetables, beans, and whole grains. Talk with your dietitian about how many servings of carbs you can eat at each meal.  Eat 4-6 oz (112-168 g) of lean protein each day, such as lean meat, chicken, fish, eggs, or tofu. One ounce (oz) of lean protein is equal to: ? 1 oz (28 g) of meat, chicken, or fish. ? 1 egg. ?  cup (62 g) of tofu.  Eat some foods each day that contain healthy fats, such as avocado, nuts, seeds, and fish.   What foods should I eat? Fruits Berries. Apples. Oranges. Peaches. Apricots. Plums. Grapes. Mango. Papaya. Pomegranate. Kiwi. Cherries. Vegetables Lettuce. Spinach. Leafy greens, including kale, chard, collard greens, and mustard greens. Beets. Cauliflower. Cabbage. Broccoli. Carrots. Green beans. Tomatoes. Peppers. Onions. Cucumbers. Brussels sprouts. Grains Whole grains, such as whole-wheat or whole-grain bread, crackers, tortillas, cereal, and pasta. Unsweetened oatmeal. Quinoa. Brown or wild rice. Meats and other proteins Seafood. Poultry without skin. Lean cuts of poultry and beef. Tofu. Nuts. Seeds. Dairy Low-fat or fat-free dairy products such as milk, yogurt, and cheese. The items listed above may not be a complete list of foods and beverages you can eat. Contact a dietitian for more information. What foods should I avoid? Fruits Fruits canned with  syrup. Vegetables Canned vegetables. Frozen vegetables with butter or cream sauce. Grains Refined white flour and flour products such as bread, pasta, snack foods, and cereals. Avoid all processed foods. Meats and other proteins Fatty cuts of meat. Poultry with skin. Breaded or fried meats. Processed meat. Avoid saturated fats. Dairy Full-fat yogurt, cheese, or milk. Beverages Sweetened drinks, such as soda or iced tea. The items listed above may not be a complete list of foods and beverages you should avoid. Contact a dietitian for more information. Questions to ask a health care provider  Do I need to meet with a diabetes educator?  Do I need to meet with a dietitian?  What number can I call if I have questions?  When are the best times to check my blood glucose? Where to find more information:  American Diabetes Association: diabetes.org  Academy of Nutrition and Dietetics: www.eatright.org  National Institute of Diabetes and Digestive and Kidney Diseases: www.niddk.nih.gov  Association of Diabetes Care and Education Specialists: www.diabeteseducator.org Summary  It is important to have healthy eating   habits because your blood sugar (glucose) levels are greatly affected by what you eat and drink.  A healthy meal plan will help you control your blood glucose and maintain a healthy lifestyle.  Your health care provider may recommend that you work with a dietitian to make a meal plan that is best for you.  Keep in mind that carbohydrates (carbs) and alcohol have immediate effects on your blood glucose levels. It is important to count carbs and to use alcohol carefully. This information is not intended to replace advice given to you by your health care provider. Make sure you discuss any questions you have with your health care provider. Document Revised: 03/26/2019 Document Reviewed: 03/26/2019 Elsevier Patient Education  2021 Elsevier Inc.  

## 2020-07-08 ENCOUNTER — Other Ambulatory Visit: Payer: Self-pay | Admitting: Family

## 2020-07-08 DIAGNOSIS — E119 Type 2 diabetes mellitus without complications: Secondary | ICD-10-CM

## 2020-07-08 DIAGNOSIS — E1169 Type 2 diabetes mellitus with other specified complication: Secondary | ICD-10-CM

## 2020-07-08 LAB — CBC WITH DIFFERENTIAL/PLATELET
Basophils Absolute: 0.1 10*3/uL (ref 0.0–0.2)
Basos: 1 %
EOS (ABSOLUTE): 0.3 10*3/uL (ref 0.0–0.4)
Eos: 3 %
Hematocrit: 36.7 % (ref 34.0–46.6)
Hemoglobin: 12.1 g/dL (ref 11.1–15.9)
Immature Grans (Abs): 0 10*3/uL (ref 0.0–0.1)
Immature Granulocytes: 0 %
Lymphocytes Absolute: 2.5 10*3/uL (ref 0.7–3.1)
Lymphs: 32 %
MCH: 28.4 pg (ref 26.6–33.0)
MCHC: 33 g/dL (ref 31.5–35.7)
MCV: 86 fL (ref 79–97)
Monocytes Absolute: 0.4 10*3/uL (ref 0.1–0.9)
Monocytes: 5 %
Neutrophils Absolute: 4.7 10*3/uL (ref 1.4–7.0)
Neutrophils: 59 %
Platelets: 310 10*3/uL (ref 150–450)
RBC: 4.26 x10E6/uL (ref 3.77–5.28)
RDW: 14.7 % (ref 11.7–15.4)
WBC: 7.9 10*3/uL (ref 3.4–10.8)

## 2020-07-08 LAB — CMP14+EGFR
ALT: 29 IU/L (ref 0–32)
AST: 27 IU/L (ref 0–40)
Albumin/Globulin Ratio: 1.7 (ref 1.2–2.2)
Albumin: 4.2 g/dL (ref 3.8–4.8)
Alkaline Phosphatase: 64 IU/L (ref 44–121)
BUN/Creatinine Ratio: 28 — ABNORMAL HIGH (ref 9–23)
BUN: 15 mg/dL (ref 6–24)
Bilirubin Total: 0.3 mg/dL (ref 0.0–1.2)
CO2: 25 mmol/L (ref 20–29)
Calcium: 9.6 mg/dL (ref 8.7–10.2)
Chloride: 102 mmol/L (ref 96–106)
Creatinine, Ser: 0.53 mg/dL — ABNORMAL LOW (ref 0.57–1.00)
Globulin, Total: 2.5 g/dL (ref 1.5–4.5)
Glucose: 175 mg/dL — ABNORMAL HIGH (ref 65–99)
Potassium: 4.7 mmol/L (ref 3.5–5.2)
Sodium: 142 mmol/L (ref 134–144)
Total Protein: 6.7 g/dL (ref 6.0–8.5)
eGFR: 118 mL/min/{1.73_m2} (ref >59)

## 2020-07-08 LAB — VITAMIN D 25 HYDROXY (VIT D DEFICIENCY, FRACTURES): Vit D, 25-Hydroxy: 50.6 ng/mL (ref 30.0–100.0)

## 2020-07-08 MED ORDER — OZEMPIC (1 MG/DOSE) 4 MG/3ML ~~LOC~~ SOPN: 1.0000 mg | PEN_INJECTOR | SUBCUTANEOUS | 2 refills | Status: DC

## 2020-07-08 MED ORDER — INSULIN GLARGINE (1 UNIT DIAL) 300 UNIT/ML ~~LOC~~ SOPN: 10.0000 [IU] | PEN_INJECTOR | Freq: Every evening | SUBCUTANEOUS | 2 refills | Status: DC

## 2020-07-13 ENCOUNTER — Telehealth: Payer: Self-pay

## 2020-07-13 NOTE — Telephone Encounter (Signed)
The eye exam we have from [facility] states no Diabetic Retinopathy.

## 2020-07-15 ENCOUNTER — Telehealth: Payer: Self-pay

## 2020-07-15 NOTE — Telephone Encounter (Signed)
Pt wants to know if she is to take ozempic metformin and toujeo. Please call back to clarify how to take insulin

## 2020-07-16 NOTE — Telephone Encounter (Signed)
Patient notified and verbalized understanding. States that she will call back for follow up appt.

## 2020-07-16 NOTE — Telephone Encounter (Signed)
Yes, she should take all these. Her A1C was high and needs to be less than 7. Strict low carb diet. Please make sure she has a follow up with me in 1-2 months.

## 2020-07-28 ENCOUNTER — Other Ambulatory Visit: Payer: Self-pay | Admitting: Family

## 2020-07-28 DIAGNOSIS — K219 Gastro-esophageal reflux disease without esophagitis: Secondary | ICD-10-CM

## 2020-07-31 ENCOUNTER — Other Ambulatory Visit: Payer: Self-pay | Admitting: Family

## 2020-08-01 ENCOUNTER — Other Ambulatory Visit: Payer: Self-pay | Admitting: Family

## 2020-08-01 DIAGNOSIS — K219 Gastro-esophageal reflux disease without esophagitis: Secondary | ICD-10-CM

## 2020-08-06 ENCOUNTER — Other Ambulatory Visit: Payer: Self-pay | Admitting: Family

## 2020-08-06 ENCOUNTER — Telehealth: Payer: Self-pay

## 2020-08-06 DIAGNOSIS — K219 Gastro-esophageal reflux disease without esophagitis: Secondary | ICD-10-CM

## 2020-08-06 NOTE — Telephone Encounter (Incomplete)
  Prescription Request  08/06/2020  What is the name of the medication or equipment?   Have you contacted your pharmacy to request a refill? (if applicable) ***  Which pharmacy would you like this sent to? ***   Patient notified that their request is being sent to the clinical staff for review and that they should receive a response within 2 business days.

## 2020-08-06 NOTE — Telephone Encounter (Signed)
RX has been sent to [facility]

## 2020-08-11 ENCOUNTER — Other Ambulatory Visit: Payer: Self-pay

## 2020-08-11 MED ORDER — ONETOUCH VERIO VI STRP: ORAL_STRIP | 11 refills | Status: AC

## 2020-10-01 ENCOUNTER — Other Ambulatory Visit: Payer: Self-pay | Admitting: Family

## 2020-10-01 DIAGNOSIS — E1169 Type 2 diabetes mellitus with other specified complication: Secondary | ICD-10-CM

## 2020-10-01 DIAGNOSIS — E119 Type 2 diabetes mellitus without complications: Secondary | ICD-10-CM

## 2020-10-08 ENCOUNTER — Ambulatory Visit: Payer: 59 | Admitting: Family

## 2020-11-05 ENCOUNTER — Other Ambulatory Visit: Payer: Self-pay | Admitting: Family

## 2020-11-05 DIAGNOSIS — E119 Type 2 diabetes mellitus without complications: Secondary | ICD-10-CM

## 2020-11-05 DIAGNOSIS — E1169 Type 2 diabetes mellitus with other specified complication: Secondary | ICD-10-CM

## 2020-11-17 ENCOUNTER — Ambulatory Visit (INDEPENDENT_AMBULATORY_CARE_PROVIDER_SITE_OTHER): Payer: 59 | Admitting: Family

## 2020-11-17 ENCOUNTER — Encounter: Payer: Self-pay | Admitting: Family

## 2020-11-17 ENCOUNTER — Other Ambulatory Visit: Payer: Self-pay

## 2020-11-17 VITALS — BP 133/87 | HR 85 | Temp 97.4°F | Ht 64.0 in | Wt 312.6 lb

## 2020-11-17 DIAGNOSIS — E785 Hyperlipidemia, unspecified: Secondary | ICD-10-CM

## 2020-11-17 DIAGNOSIS — E1169 Type 2 diabetes mellitus with other specified complication: Secondary | ICD-10-CM

## 2020-11-17 DIAGNOSIS — E282 Polycystic ovarian syndrome: Secondary | ICD-10-CM | POA: Diagnosis not present

## 2020-11-17 DIAGNOSIS — F411 Generalized anxiety disorder: Secondary | ICD-10-CM | POA: Diagnosis not present

## 2020-11-17 DIAGNOSIS — K219 Gastro-esophageal reflux disease without esophagitis: Secondary | ICD-10-CM | POA: Diagnosis not present

## 2020-11-17 DIAGNOSIS — E559 Vitamin D deficiency, unspecified: Secondary | ICD-10-CM

## 2020-11-17 DIAGNOSIS — M1A9XX0 Chronic gout, unspecified, without tophus (tophi): Secondary | ICD-10-CM

## 2020-11-17 LAB — BAYER DCA HB A1C WAIVED: HB A1C (BAYER DCA - WAIVED): 8.6 % — ABNORMAL HIGH (ref <7.0)

## 2020-11-17 NOTE — Progress Notes (Signed)
Subjective:    Patient ID: Glenda Mitchell, female    DOB: [birth year], 45 y.o.   MRN: [id number]  Chief Complaint  Patient presents with   Medical Management of Chronic Issues   PT presents to the office today for chronic follow up. She is followed by GYN annually for PCOS. She takes provera every 3 months to bring on her menstrual cycle. Diabetes She presents for her follow-up diabetic visit. She has type 2 diabetes mellitus. Her disease course has been stable. Hypoglycemia symptoms include nervousness/anxiousness. Associated symptoms include foot paresthesias. Pertinent negatives for diabetes include no blurred vision. Symptoms are stable. Diabetic complications include heart disease and peripheral neuropathy. Risk factors for coronary artery disease include dyslipidemia, diabetes mellitus, hypertension, sedentary lifestyle and post-menopausal. She is following a generally unhealthy diet. Her overall blood glucose range is 180-200 mg/dl. An ACE inhibitor/angiotensin II receptor blocker is being taken. Eye exam is current.  Gastroesophageal Reflux She complains of belching and heartburn. This is a chronic problem. The current episode started more than 1 year ago. The problem occurs occasionally. The problem has been waxing and waning. The symptoms are aggravated by certain foods. She has tried an antacid and a PPI for the symptoms. The treatment provided moderate relief.  Hyperlipidemia This is a chronic problem. The current episode started more than 1 year ago. Exacerbating diseases include obesity. Current antihyperlipidemic treatment includes statins. The current treatment provides moderate improvement of lipids. Risk factors for coronary artery disease include dyslipidemia, a sedentary lifestyle and post-menopausal.  Anxiety Presents for follow-up visit. Symptoms include depressed mood, excessive worry, irritability, nervous/anxious behavior and restlessness. Symptoms occur most days.        Review of Systems  Constitutional:  Positive for irritability.  Eyes:  Negative for blurred vision.  Gastrointestinal:  Positive for heartburn.  Psychiatric/Behavioral:  The patient is nervous/anxious.   All other systems reviewed and are negative.     Objective:   Physical Exam Vitals reviewed.  Constitutional:      General: She is not in acute distress.    Appearance: She is well-developed. She is obese.  HENT:     Head: Normocephalic and atraumatic.     Right Ear: External ear normal.  Eyes:     Pupils: Pupils are equal, round, and reactive to light.  Neck:     Thyroid: No thyromegaly.  Cardiovascular:     Rate and Rhythm: Normal rate and regular rhythm.     Heart sounds: Normal heart sounds. No murmur heard. Pulmonary:     Effort: Pulmonary effort is normal. No respiratory distress.     Breath sounds: Normal breath sounds. No wheezing.  Abdominal:     General: Bowel sounds are normal. There is no distension.     Palpations: Abdomen is soft.     Tenderness: There is no abdominal tenderness.  Musculoskeletal:        General: No tenderness. Normal range of motion.     Cervical back: Normal range of motion and neck supple.  Skin:    General: Skin is warm and dry.  Neurological:     Mental Status: She is alert and oriented to person, place, and time.     Cranial Nerves: No cranial nerve deficit.     Deep Tendon Reflexes: Reflexes are normal and symmetric.  Psychiatric:        Behavior: Behavior normal.        Thought Content: Thought content normal.  Judgment: Judgment normal.      BP 133/87   Pulse 85   Temp (!) 97.4 F (36.3 C) (Temporal)   Ht 5' 4"  (1.626 m)   Wt (!) 312 lb 9.6 oz (141.8 kg)   SpO2 94%   BMI 53.66 kg/m      Assessment & Plan:  Glenda Mitchell comes in today with chief complaint of Medical Management of Chronic Issues   Diagnosis and orders addressed:  1. Gastroesophageal reflux disease without esophagitis -  CMP14+EGFR - CBC with Differential/Platelet  2. Type 2 diabetes mellitus with other specified complication, without long-term current use of insulin (HCC) - Bayer DCA Hb A1c Waived - CMP14+EGFR - CBC with Differential/Platelet  3. Hyperlipidemia associated with type 2 diabetes mellitus (HCC) - CMP14+EGFR - CBC with Differential/Platelet  4. PCOS (polycystic ovarian syndrome) - CMP14+EGFR - CBC with Differential/Platelet  5. GAD (generalized anxiety disorder) - CMP14+EGFR - CBC with Differential/Platelet  6. Vitamin D deficiency - CMP14+EGFR - CBC with Differential/Platelet  7. Chronic gout without tophus, unspecified cause, unspecified site - CMP14+EGFR - CBC with Differential/Platelet   Labs pending Health Maintenance reviewed Diet and exercise encouraged  Follow up plan: 3 months    Evelina Dun, FNP

## 2020-11-17 NOTE — Patient Instructions (Signed)
Diabetes Mellitus and Nutrition, Adult When you have diabetes, or diabetes mellitus, it is very important to have healthy eating habits because your blood sugar (glucose) levels are greatly affected by what you eat and drink. Eating healthy foods in the right amounts, at about the same times every day, can help you:  Control your blood glucose.  Lower your risk of heart disease.  Improve your blood pressure.  Reach or maintain a healthy weight. What can affect my meal plan? Every person with diabetes is different, and each person has different needs for a meal plan. Your health care provider may recommend that you work with a dietitian to make a meal plan that is best for you. Your meal plan may vary depending on factors such as:  The calories you need.  The medicines you take.  Your weight.  Your blood glucose, blood pressure, and cholesterol levels.  Your activity level.  Other health conditions you have, such as heart or kidney disease. How do carbohydrates affect me? Carbohydrates, also called carbs, affect your blood glucose level more than any other type of food. Eating carbs naturally raises the amount of glucose in your blood. Carb counting is a method for keeping track of how many carbs you eat. Counting carbs is important to keep your blood glucose at a healthy level, especially if you use insulin or take certain oral diabetes medicines. It is important to know how many carbs you can safely have in each meal. This is different for every person. Your dietitian can help you calculate how many carbs you should have at each meal and for each snack. How does alcohol affect me? Alcohol can cause a sudden decrease in blood glucose (hypoglycemia), especially if you use insulin or take certain oral diabetes medicines. Hypoglycemia can be a life-threatening condition. Symptoms of hypoglycemia, such as sleepiness, dizziness, and confusion, are similar to symptoms of having too much  alcohol.  Do not drink alcohol if: ? Your health care provider tells you not to drink. ? You are pregnant, may be pregnant, or are planning to become pregnant.  If you drink alcohol: ? Do not drink on an empty stomach. ? Limit how much you use to:  0-1 drink a day for women.  0-2 drinks a day for men. ? Be aware of how much alcohol is in your drink. In the [place] one drink equals one 12 oz bottle of beer (355 mL), one 5 oz glass of wine (148 mL), or one 1 oz glass of hard liquor (44 mL). ? Keep yourself hydrated with water, diet soda, or unsweetened iced tea.  Keep in mind that regular soda, juice, and other mixers may contain a lot of sugar and must be counted as carbs. What are tips for following this plan? Reading food labels  Start by checking the serving size on the "Nutrition Facts" label of packaged foods and drinks. The amount of calories, carbs, fats, and other nutrients listed on the label is based on one serving of the item. Many items contain more than one serving per package.  Check the total grams (g) of carbs in one serving. You can calculate the number of servings of carbs in one serving by dividing the total carbs by 15. For example, if a food has 30 g of total carbs per serving, it would be equal to 2 servings of carbs.  Check the number of grams (g) of saturated fats and trans fats in one serving. Choose foods that have   a low amount or none of these fats.  Check the number of milligrams (mg) of salt (sodium) in one serving. Most people should limit total sodium intake to less than 2,300 mg per day.  Always check the nutrition information of foods labeled as "low-fat" or "nonfat." These foods may be higher in added sugar or refined carbs and should be avoided.  Talk to your dietitian to identify your daily goals for nutrients listed on the label. Shopping  Avoid buying canned, pre-made, or processed foods. These foods tend to be high in fat, sodium, and added  sugar.  Shop around the outside edge of the grocery store. This is where you will most often find fresh fruits and vegetables, bulk grains, fresh meats, and fresh dairy. Cooking  Use low-heat cooking methods, such as baking, instead of high-heat cooking methods like deep frying.  Cook using healthy oils, such as olive, canola, or sunflower oil.  Avoid cooking with butter, cream, or high-fat meats. Meal planning  Eat meals and snacks regularly, preferably at the same times every day. Avoid going long periods of time without eating.  Eat foods that are high in fiber, such as fresh fruits, vegetables, beans, and whole grains. Talk with your dietitian about how many servings of carbs you can eat at each meal.  Eat 4-6 oz (112-168 g) of lean protein each day, such as lean meat, chicken, fish, eggs, or tofu. One ounce (oz) of lean protein is equal to: ? 1 oz (28 g) of meat, chicken, or fish. ? 1 egg. ?  cup (62 g) of tofu.  Eat some foods each day that contain healthy fats, such as avocado, nuts, seeds, and fish.   What foods should I eat? Fruits Berries. Apples. Oranges. Peaches. Apricots. Plums. Grapes. Mango. Papaya. Pomegranate. Kiwi. Cherries. Vegetables Lettuce. Spinach. Leafy greens, including kale, chard, collard greens, and mustard greens. Beets. Cauliflower. Cabbage. Broccoli. Carrots. Green beans. Tomatoes. Peppers. Onions. Cucumbers. Brussels sprouts. Grains Whole grains, such as whole-wheat or whole-grain bread, crackers, tortillas, cereal, and pasta. Unsweetened oatmeal. Quinoa. Brown or wild rice. Meats and other proteins Seafood. Poultry without skin. Lean cuts of poultry and beef. Tofu. Nuts. Seeds. Dairy Low-fat or fat-free dairy products such as milk, yogurt, and cheese. The items listed above may not be a complete list of foods and beverages you can eat. Contact a dietitian for more information. What foods should I avoid? Fruits Fruits canned with  syrup. Vegetables Canned vegetables. Frozen vegetables with butter or cream sauce. Grains Refined white flour and flour products such as bread, pasta, snack foods, and cereals. Avoid all processed foods. Meats and other proteins Fatty cuts of meat. Poultry with skin. Breaded or fried meats. Processed meat. Avoid saturated fats. Dairy Full-fat yogurt, cheese, or milk. Beverages Sweetened drinks, such as soda or iced tea. The items listed above may not be a complete list of foods and beverages you should avoid. Contact a dietitian for more information. Questions to ask a health care provider  Do I need to meet with a diabetes educator?  Do I need to meet with a dietitian?  What number can I call if I have questions?  When are the best times to check my blood glucose? Where to find more information:  American Diabetes Association: diabetes.org  Academy of Nutrition and Dietetics: www.eatright.org  National Institute of Diabetes and Digestive and Kidney Diseases: www.niddk.nih.gov  Association of Diabetes Care and Education Specialists: www.diabeteseducator.org Summary  It is important to have healthy eating   habits because your blood sugar (glucose) levels are greatly affected by what you eat and drink.  A healthy meal plan will help you control your blood glucose and maintain a healthy lifestyle.  Your health care provider may recommend that you work with a dietitian to make a meal plan that is best for you.  Keep in mind that carbohydrates (carbs) and alcohol have immediate effects on your blood glucose levels. It is important to count carbs and to use alcohol carefully. This information is not intended to replace advice given to you by your health care provider. Make sure you discuss any questions you have with your health care provider. Document Revised: 03/26/2019 Document Reviewed: 03/26/2019 Elsevier Patient Education  2021 Elsevier Inc.  

## 2020-11-19 ENCOUNTER — Other Ambulatory Visit: Payer: Self-pay | Admitting: Family

## 2020-11-19 LAB — CBC WITH DIFFERENTIAL/PLATELET
Basophils Absolute: 0.1 10*3/uL (ref 0.0–0.2)
Basos: 1 %
EOS (ABSOLUTE): 0.3 10*3/uL (ref 0.0–0.4)
Eos: 3 %
Hematocrit: 39.4 % (ref 34.0–46.6)
Hemoglobin: 12.6 g/dL (ref 11.1–15.9)
Immature Grans (Abs): 0 10*3/uL (ref 0.0–0.1)
Immature Granulocytes: 0 %
Lymphocytes Absolute: 2.7 10*3/uL (ref 0.7–3.1)
Lymphs: 29 %
MCH: 27.6 pg (ref 26.6–33.0)
MCHC: 32 g/dL (ref 31.5–35.7)
MCV: 86 fL (ref 79–97)
Monocytes Absolute: 0.5 10*3/uL (ref 0.1–0.9)
Monocytes: 6 %
Neutrophils Absolute: 5.7 10*3/uL (ref 1.4–7.0)
Neutrophils: 61 %
Platelets: 310 10*3/uL (ref 150–450)
RBC: 4.56 x10E6/uL (ref 3.77–5.28)
RDW: 14.5 % (ref 11.7–15.4)
WBC: 9.3 10*3/uL (ref 3.4–10.8)

## 2020-11-19 LAB — CMP14+EGFR
ALT: 22 IU/L (ref 0–32)
AST: 13 IU/L (ref 0–40)
Albumin/Globulin Ratio: 1.8 (ref 1.2–2.2)
Albumin: 4.4 g/dL (ref 3.8–4.8)
Alkaline Phosphatase: 75 IU/L (ref 44–121)
BUN/Creatinine Ratio: 25 — ABNORMAL HIGH (ref 9–23)
BUN: 14 mg/dL (ref 6–24)
Bilirubin Total: 0.3 mg/dL (ref 0.0–1.2)
CO2: 25 mmol/L (ref 20–29)
Calcium: 9.8 mg/dL (ref 8.7–10.2)
Chloride: 100 mmol/L (ref 96–106)
Creatinine, Ser: 0.55 mg/dL — ABNORMAL LOW (ref 0.57–1.00)
Globulin, Total: 2.4 g/dL (ref 1.5–4.5)
Glucose: 228 mg/dL — ABNORMAL HIGH (ref 65–99)
Potassium: 4.7 mmol/L (ref 3.5–5.2)
Sodium: 138 mmol/L (ref 134–144)
Total Protein: 6.8 g/dL (ref 6.0–8.5)
eGFR: 117 mL/min/{1.73_m2} (ref >59)

## 2020-11-27 ENCOUNTER — Other Ambulatory Visit: Payer: Self-pay | Admitting: Family

## 2020-11-27 DIAGNOSIS — E1169 Type 2 diabetes mellitus with other specified complication: Secondary | ICD-10-CM

## 2020-11-27 DIAGNOSIS — E119 Type 2 diabetes mellitus without complications: Secondary | ICD-10-CM

## 2020-12-10 ENCOUNTER — Ambulatory Visit (INDEPENDENT_AMBULATORY_CARE_PROVIDER_SITE_OTHER): Payer: 59 | Admitting: Pharmacist

## 2020-12-10 ENCOUNTER — Other Ambulatory Visit: Payer: Self-pay

## 2020-12-10 DIAGNOSIS — E1169 Type 2 diabetes mellitus with other specified complication: Secondary | ICD-10-CM | POA: Diagnosis not present

## 2020-12-10 NOTE — Progress Notes (Signed)
    12/10/2020 Name: Glenda Mitchell MRN: [id number] DOB: August 02, 1975   S:  45 Yof Presents for diabetes evaluation, education, and management Patient was referred and last seen by Primary Care Provider on 11/17/20.  Insurance coverage/medication affordability: [employer]  Patient reports adherence with medications. Current diabetes medications include: ozempic 1mg ,metformin, TOUJEO Current hypertension medications include: losartan Goal 130/80 Current hyperlipidemia medications include: atorvastatin    Patient denies hypoglycemic events.   Patient reported dietary habits: Eats 2-3 meals/day Snacks: cheese Drinks:water, diet drinks Discussed meal planning options and Plate method for healthy eating Avoid sugary drinks and desserts Incorporate balanced protein, non starchy veggies, 1 serving of carbohydrate with each meal Increase water intake Increase physical activity as able   Patient-reported exercise habits: reports she will start walking, having difficulty with neuropathy   Patient reports neuropathy (nerve pain).    O:  Lab Results  Component Value Date   HGBA1C 8.6 (H) 11/17/2020     Lipid Panel     Component Value Date/Time   CHOL 231 (H) 01/31/2020 1255   TRIG 366 (H) 01/31/2020 1255   HDL 32 (L) 01/31/2020 1255   CHOLHDL 7.2 (H) 01/31/2020 1255   LDLCALC 133 (H) 01/31/2020 1255     Home fasting blood sugars: <200  2 hour post-meal/random blood sugars: 180-200s.    Clinical Atherosclerotic Cardiovascular Disease (ASCVD): No   The 10-year ASCVD risk score 04/01/2020 DC Jr., et al., 2013) is: 6.4%   Values used to calculate the score:     Age: 9 years     Sex: Female     Is Non-Hispanic African American: No     Diabetic: Yes     Tobacco smoker: No     Systolic Blood Pressure: 133 mmHg     Is BP treated: Yes     HDL Cholesterol: 32 mg/dL     Total Cholesterol: 231 mg/dL    A/P:  Diabetes 55 currently uncontrolled, however patient has  improved glycemic control. Patient is adherent with medication. She has had some difficulty with GI side effects from Ozempic.  She reports she tolerated her last dose of Ozempic 1 mg well.   -INCREASE ozempic TO 2MG  SQ WEEKLY OVER TIME. PATIENT WAS ABLE TO DEMONSTRATE HOW TO SLOWLY INCREASE DOSE TO 2MG  WEEKLY.  She prefers to take at night.   -CONTINUE METFORMIN 1G TWICE DAILY  -CONTINUE TOUJEO 10 UNITS DAILY FOR NOW   -Extensively discussed pathophysiology of diabetes, recommended lifestyle interventions, dietary effects on blood sugar control  -Counseled on s/sx of and management of hypoglycemia  -Next A1C anticipated 3 months.   Written patient instructions provided.  Total time in face to face counseling 30 minutes.   J1OA, PharmD, BCPS Clinical Pharmacist, [facility]  II Phone [phone]

## 2020-12-15 ENCOUNTER — Other Ambulatory Visit: Payer: Self-pay | Admitting: Family

## 2020-12-15 DIAGNOSIS — M1A9XX0 Chronic gout, unspecified, without tophus (tophi): Secondary | ICD-10-CM

## 2020-12-16 MED ORDER — SEMAGLUTIDE (2 MG/DOSE) 8 MG/3ML ~~LOC~~ SOPN: 2.0000 mg | PEN_INJECTOR | SUBCUTANEOUS | 2 refills | Status: DC

## 2020-12-17 ENCOUNTER — Telehealth: Payer: Self-pay | Admitting: Family

## 2020-12-18 NOTE — Telephone Encounter (Signed)
Pt called requesting to speak with Glenda Mitchell regarding questions with her medications and to give her sugar readings.  Pt said she took 1.5 units of Ozempic and felt nauseous. Pt took some nausea medicine and felt better. Pt says her sugar was 124 fasting yesterday morning and then after eating it was 150. Pt said when she checked it this morning it was 79 fasting. Pt did not take her Metformin yesterday morning or this morning but did take last night and will take tonight.   Wants to know if she needs to lower her insulin dosage? Pt took 10 units of her insulin last night. Pt says she was on 15 units, but because her sugar has been lower, she only did 10 units.  Please advise and call patient.

## 2020-12-22 ENCOUNTER — Telehealth: Payer: Self-pay | Admitting: Family

## 2020-12-22 MED ORDER — ONDANSETRON 4 MG PO TBDP: 4.0000 mg | ORAL_TABLET | Freq: Three times a day (TID) | ORAL | 0 refills | Status: DC | PRN

## 2020-12-22 NOTE — Telephone Encounter (Signed)
RETURNED CALL NO ANSWER LEFT VM ENCOURAGING RETURN CALL

## 2020-12-22 NOTE — Telephone Encounter (Signed)
Ozempic 1.5mg  last Wednesday will repeat tomorrow Tolerating okay now, some nausea, will give zofran Decrease toujeo (insulin) to 8-10 units daily Continue metformin

## 2020-12-23 ENCOUNTER — Ambulatory Visit: Payer: 59 | Admitting: Family

## 2021-01-15 ENCOUNTER — Encounter: Payer: Self-pay | Admitting: Family

## 2021-01-15 ENCOUNTER — Other Ambulatory Visit: Payer: Self-pay

## 2021-01-15 ENCOUNTER — Ambulatory Visit (INDEPENDENT_AMBULATORY_CARE_PROVIDER_SITE_OTHER): Payer: 59 | Admitting: Family

## 2021-01-15 VITALS — BP 121/76 | HR 69 | Temp 97.9°F | Resp 20 | Ht 64.0 in | Wt 306.0 lb

## 2021-01-15 DIAGNOSIS — E1169 Type 2 diabetes mellitus with other specified complication: Secondary | ICD-10-CM | POA: Diagnosis not present

## 2021-01-15 DIAGNOSIS — K219 Gastro-esophageal reflux disease without esophagitis: Secondary | ICD-10-CM | POA: Diagnosis not present

## 2021-01-15 DIAGNOSIS — F411 Generalized anxiety disorder: Secondary | ICD-10-CM | POA: Diagnosis not present

## 2021-01-15 DIAGNOSIS — E785 Hyperlipidemia, unspecified: Secondary | ICD-10-CM

## 2021-01-15 DIAGNOSIS — E282 Polycystic ovarian syndrome: Secondary | ICD-10-CM | POA: Diagnosis not present

## 2021-01-15 LAB — BMP8+EGFR
BUN/Creatinine Ratio: 22 (ref 9–23)
BUN: 15 mg/dL (ref 6–24)
CO2: 24 mmol/L (ref 20–29)
Calcium: 10 mg/dL (ref 8.7–10.2)
Chloride: 94 mmol/L — ABNORMAL LOW (ref 96–106)
Creatinine, Ser: 0.69 mg/dL (ref 0.57–1.00)
Glucose: 160 mg/dL — ABNORMAL HIGH (ref 65–99)
Potassium: 5 mmol/L (ref 3.5–5.2)
Sodium: 133 mmol/L — ABNORMAL LOW (ref 134–144)
eGFR: 110 mL/min/{1.73_m2} (ref >59)

## 2021-01-15 LAB — BAYER DCA HB A1C WAIVED: HB A1C (BAYER DCA - WAIVED): 7.9 % — ABNORMAL HIGH (ref 4.8–5.6)

## 2021-01-15 NOTE — Progress Notes (Signed)
Subjective:    Patient ID: Glenda Mitchell, female    DOB: December 26, 1975, 45 y.o.   MRN: [id number]  Chief Complaint  Patient presents with   Medical Management of Chronic Issues   PT presents to the office today for chronic follow up. She is followed by GYN annually for PCOS. She takes provera every 3 months to bring on her menstrual cycle.  She is currently taking Ozempic  2 mg and has lost 6 lbs since July.  Diabetes She presents for her follow-up diabetic visit. She has type 2 diabetes mellitus. Hypoglycemia symptoms include nervousness/anxiousness. Associated symptoms include foot paresthesias. Pertinent negatives for diabetes include no blurred vision. Symptoms are stable. Diabetic complications include heart disease and peripheral neuropathy. Risk factors for coronary artery disease include dyslipidemia, diabetes mellitus, hypertension, sedentary lifestyle and post-menopausal. She is following a generally unhealthy diet. Her overall blood glucose range is 140-180 mg/dl.  Anxiety Presents for follow-up visit. Symptoms include excessive worry, irritability and nervous/anxious behavior. Symptoms occur most days. The severity of symptoms is moderate.    Gastroesophageal Reflux She complains of belching and heartburn. This is a chronic problem. The current episode started more than 1 year ago. The problem occurs frequently. Risk factors include obesity. She has tried a PPI for the symptoms. The treatment provided moderate relief.  Hyperlipidemia This is a chronic problem. The current episode started more than 1 year ago. The problem is controlled. Exacerbating diseases include obesity. Current antihyperlipidemic treatment includes statins. The current treatment provides moderate improvement of lipids. Risk factors for coronary artery disease include dyslipidemia, diabetes mellitus, hypertension, a sedentary lifestyle and post-menopausal.     Review of Systems  Constitutional:  Positive  for irritability.  Eyes:  Negative for blurred vision.  Gastrointestinal:  Positive for heartburn.  Psychiatric/Behavioral:  The patient is nervous/anxious.   All other systems reviewed and are negative.     Objective:   Physical Exam Vitals reviewed.  Constitutional:      General: She is not in acute distress.    Appearance: She is well-developed. She is obese.  HENT:     Head: Normocephalic and atraumatic.     Right Ear: Tympanic membrane normal.     Left Ear: Tympanic membrane normal.  Eyes:     Pupils: Pupils are equal, round, and reactive to light.  Neck:     Thyroid: No thyromegaly.  Cardiovascular:     Rate and Rhythm: Normal rate and regular rhythm.     Heart sounds: Normal heart sounds. No murmur heard. Pulmonary:     Effort: Pulmonary effort is normal. No respiratory distress.     Breath sounds: Normal breath sounds. No wheezing.  Abdominal:     General: Bowel sounds are normal. There is no distension.     Palpations: Abdomen is soft.     Tenderness: There is no abdominal tenderness.  Musculoskeletal:        General: No tenderness. Normal range of motion.     Cervical back: Normal range of motion and neck supple.  Skin:    General: Skin is warm and dry.  Neurological:     Mental Status: She is alert and oriented to person, place, and time.     Cranial Nerves: No cranial nerve deficit.     Deep Tendon Reflexes: Reflexes are normal and symmetric.  Psychiatric:        Behavior: Behavior normal.        Thought Content: Thought content normal.  Judgment: Judgment normal.      BP 121/76   Pulse 69   Temp 97.9 F (36.6 C) (Temporal)   Resp 20   Ht 5' 4" (1.626 m)   Wt (!) 306 lb (138.8 kg)   SpO2 99%   BMI 52.52 kg/m      Assessment & Plan:  Glenda Mitchell comes in today with chief complaint of Medical Management of Chronic Issues   Diagnosis and orders addressed:  1. Type 2 diabetes mellitus with other specified complication, without  long-term current use of insulin (HCC) - Bayer DCA Hb A1c Waived - BMP8+EGFR  2. Hyperlipidemia associated with type 2 diabetes mellitus (HCC)  - BMP8+EGFR  3. PCOS (polycystic ovarian syndrome) - BMP8+EGFR  4. Gastroesophageal reflux disease without esophagitis - BMP8+EGFR  5. GAD (generalized anxiety disorder)  - BMP8+EGFR   Labs pending Health Maintenance reviewed Diet and exercise encouraged  Follow up plan: 3 months   Evelina Dun, FNP

## 2021-01-15 NOTE — Patient Instructions (Signed)
Diabetes Mellitus and Nutrition, Adult When you have diabetes, or diabetes mellitus, it is very important to have healthy eating habits because your blood sugar (glucose) levels are greatly affected by what you eat and drink. Eating healthy foods in the right amounts, at about the same times every day, can help you:  Control your blood glucose.  Lower your risk of heart disease.  Improve your blood pressure.  Reach or maintain a healthy weight. What can affect my meal plan? Every person with diabetes is different, and each person has different needs for a meal plan. Your health care provider may recommend that you work with a dietitian to make a meal plan that is best for you. Your meal plan may vary depending on factors such as:  The calories you need.  The medicines you take.  Your weight.  Your blood glucose, blood pressure, and cholesterol levels.  Your activity level.  Other health conditions you have, such as heart or kidney disease. How do carbohydrates affect me? Carbohydrates, also called carbs, affect your blood glucose level more than any other type of food. Eating carbs naturally raises the amount of glucose in your blood. Carb counting is a method for keeping track of how many carbs you eat. Counting carbs is important to keep your blood glucose at a healthy level, especially if you use insulin or take certain oral diabetes medicines. It is important to know how many carbs you can safely have in each meal. This is different for every person. Your dietitian can help you calculate how many carbs you should have at each meal and for each snack. How does alcohol affect me? Alcohol can cause a sudden decrease in blood glucose (hypoglycemia), especially if you use insulin or take certain oral diabetes medicines. Hypoglycemia can be a life-threatening condition. Symptoms of hypoglycemia, such as sleepiness, dizziness, and confusion, are similar to symptoms of having too much  alcohol.  Do not drink alcohol if: ? Your health care provider tells you not to drink. ? You are pregnant, may be pregnant, or are planning to become pregnant.  If you drink alcohol: ? Do not drink on an empty stomach. ? Limit how much you use to:  0-1 drink a day for women.  0-2 drinks a day for men. ? Be aware of how much alcohol is in your drink. In the [place] one drink equals one 12 oz bottle of beer (355 mL), one 5 oz glass of wine (148 mL), or one 1 oz glass of hard liquor (44 mL). ? Keep yourself hydrated with water, diet soda, or unsweetened iced tea.  Keep in mind that regular soda, juice, and other mixers may contain a lot of sugar and must be counted as carbs. What are tips for following this plan? Reading food labels  Start by checking the serving size on the "Nutrition Facts" label of packaged foods and drinks. The amount of calories, carbs, fats, and other nutrients listed on the label is based on one serving of the item. Many items contain more than one serving per package.  Check the total grams (g) of carbs in one serving. You can calculate the number of servings of carbs in one serving by dividing the total carbs by 15. For example, if a food has 30 g of total carbs per serving, it would be equal to 2 servings of carbs.  Check the number of grams (g) of saturated fats and trans fats in one serving. Choose foods that have   a low amount or none of these fats.  Check the number of milligrams (mg) of salt (sodium) in one serving. Most people should limit total sodium intake to less than 2,300 mg per day.  Always check the nutrition information of foods labeled as "low-fat" or "nonfat." These foods may be higher in added sugar or refined carbs and should be avoided.  Talk to your dietitian to identify your daily goals for nutrients listed on the label. Shopping  Avoid buying canned, pre-made, or processed foods. These foods tend to be high in fat, sodium, and added  sugar.  Shop around the outside edge of the grocery store. This is where you will most often find fresh fruits and vegetables, bulk grains, fresh meats, and fresh dairy. Cooking  Use low-heat cooking methods, such as baking, instead of high-heat cooking methods like deep frying.  Cook using healthy oils, such as olive, canola, or sunflower oil.  Avoid cooking with butter, cream, or high-fat meats. Meal planning  Eat meals and snacks regularly, preferably at the same times every day. Avoid going long periods of time without eating.  Eat foods that are high in fiber, such as fresh fruits, vegetables, beans, and whole grains. Talk with your dietitian about how many servings of carbs you can eat at each meal.  Eat 4-6 oz (112-168 g) of lean protein each day, such as lean meat, chicken, fish, eggs, or tofu. One ounce (oz) of lean protein is equal to: ? 1 oz (28 g) of meat, chicken, or fish. ? 1 egg. ?  cup (62 g) of tofu.  Eat some foods each day that contain healthy fats, such as avocado, nuts, seeds, and fish.   What foods should I eat? Fruits Berries. Apples. Oranges. Peaches. Apricots. Plums. Grapes. Mango. Papaya. Pomegranate. Kiwi. Cherries. Vegetables Lettuce. Spinach. Leafy greens, including kale, chard, collard greens, and mustard greens. Beets. Cauliflower. Cabbage. Broccoli. Carrots. Green beans. Tomatoes. Peppers. Onions. Cucumbers. Brussels sprouts. Grains Whole grains, such as whole-wheat or whole-grain bread, crackers, tortillas, cereal, and pasta. Unsweetened oatmeal. Quinoa. Brown or wild rice. Meats and other proteins Seafood. Poultry without skin. Lean cuts of poultry and beef. Tofu. Nuts. Seeds. Dairy Low-fat or fat-free dairy products such as milk, yogurt, and cheese. The items listed above may not be a complete list of foods and beverages you can eat. Contact a dietitian for more information. What foods should I avoid? Fruits Fruits canned with  syrup. Vegetables Canned vegetables. Frozen vegetables with butter or cream sauce. Grains Refined white flour and flour products such as bread, pasta, snack foods, and cereals. Avoid all processed foods. Meats and other proteins Fatty cuts of meat. Poultry with skin. Breaded or fried meats. Processed meat. Avoid saturated fats. Dairy Full-fat yogurt, cheese, or milk. Beverages Sweetened drinks, such as soda or iced tea. The items listed above may not be a complete list of foods and beverages you should avoid. Contact a dietitian for more information. Questions to ask a health care provider  Do I need to meet with a diabetes educator?  Do I need to meet with a dietitian?  What number can I call if I have questions?  When are the best times to check my blood glucose? Where to find more information:  American Diabetes Association: diabetes.org  Academy of Nutrition and Dietetics: www.eatright.org  National Institute of Diabetes and Digestive and Kidney Diseases: www.niddk.nih.gov  Association of Diabetes Care and Education Specialists: www.diabeteseducator.org Summary  It is important to have healthy eating   habits because your blood sugar (glucose) levels are greatly affected by what you eat and drink.  A healthy meal plan will help you control your blood glucose and maintain a healthy lifestyle.  Your health care provider may recommend that you work with a dietitian to make a meal plan that is best for you.  Keep in mind that carbohydrates (carbs) and alcohol have immediate effects on your blood glucose levels. It is important to count carbs and to use alcohol carefully. This information is not intended to replace advice given to you by your health care provider. Make sure you discuss any questions you have with your health care provider. Document Revised: 03/26/2019 Document Reviewed: 03/26/2019 Elsevier Patient Education  2021 Elsevier Inc.  

## 2021-01-19 ENCOUNTER — Ambulatory Visit (INDEPENDENT_AMBULATORY_CARE_PROVIDER_SITE_OTHER): Payer: 59 | Admitting: Pharmacist

## 2021-01-19 DIAGNOSIS — E1169 Type 2 diabetes mellitus with other specified complication: Secondary | ICD-10-CM

## 2021-01-20 NOTE — Progress Notes (Signed)
    01/19/2021 Name: Glenda Mitchell MRN: [id number] DOB: 18-Nov-1975   S:  45 Yof Presents for diabetes evaluation, education, and management Patient was referred and last seen by Primary Care Provider on 01/15/21.   Insurance coverage/medication affordability: [employer]   Patient reports adherence with medications. Current diabetes medications include: ozempic 2mg , metformin, Current hypertension medications include: losartan Goal 130/80 Current hyperlipidemia medications include: atorvastatin    Patient denies hypoglycemic events.   Patient reported dietary habits: Eats 2-3 meals/day Snacks: cheese Drinks:water, diet drinks Discussed meal planning options and Plate method for healthy eating Avoid sugary drinks and desserts Incorporate balanced protein, non starchy veggies, 1 serving of carbohydrate with each meal Increase water intake Increase physical activity as able   Patient-reported exercise habits: reports she will start walking, having difficulty with neuropathy   Patient reports neuropathy (nerve pain).    O:  Lab Results  Component Value Date   HGBA1C 7.9 (H) 01/15/2021   Lipid Panel     Component Value Date/Time   CHOL 231 (H) 01/31/2020 1255   TRIG 366 (H) 01/31/2020 1255   HDL 32 (L) 01/31/2020 1255   CHOLHDL 7.2 (H) 01/31/2020 1255   LDLCALC 133 (H) 01/31/2020 1255     Home fasting blood sugars: 140-160  2 hour post-meal/random blood sugars: <225.    Clinical Atherosclerotic Cardiovascular Disease (ASCVD): No   The 10-year ASCVD risk score (Arnett DK, et al., 2019) is: 5.3%   Values used to calculate the score:     Age: 48 years     Sex: Female     Is Non-Hispanic African American: No     Diabetic: Yes     Tobacco smoker: No     Systolic Blood Pressure: 121 mmHg     Is BP treated: Yes     HDL Cholesterol: 32 mg/dL     Total Cholesterol: 231 mg/dL    A/P:  Diabetes 55 currently uncontrolled, however patient has improved glycemic  control. Patient is adherent with medication. She has had some difficulty with GI side effects from Ozempic, but is doing well on it now.   -CONTINUE OZEMPIC 2MG  SQ WEEKLY PATIENT TOLERATING She prefers to take at night Denies personal and family history of Medullary thyroid cancer (MTC)   -CONTINUE METFORMIN 1G TWICE DAILY   -PATIENT HAS DISCONTINUED HER INSULIN  -A1C HAS IMPROVED TO 7.9% (ONLY 3 WEEKS REFLECTED OF THE OZEMPIC 2MG ) THEREFORE WILL CONTINUE CURRENT MANAGEMENT   -AT FOLLOW UP, IF A1C STILL IS NOT AT GOAL WE WILL CONSIDER ADDING BACK INSULIN OR TRANSITIONING OZEMPIC TO MOUNJARO   -Extensively discussed pathophysiology of diabetes, recommended lifestyle interventions, dietary effects on blood sugar control   -Counseled on s/sx of and management of hypoglycemia   -Next A1C anticipated 3 months.     Written patient instructions provided.  Total time in face to face counseling 30 minutes.   Follow up Pharmacist on 02/04/21   , PharmD, BCPS Clinical Pharmacist, [facility]  II Phone [phone]

## 2021-01-22 ENCOUNTER — Other Ambulatory Visit: Payer: Self-pay | Admitting: Family

## 2021-01-22 DIAGNOSIS — E119 Type 2 diabetes mellitus without complications: Secondary | ICD-10-CM

## 2021-01-28 ENCOUNTER — Telehealth: Payer: Self-pay | Admitting: Family

## 2021-01-28 NOTE — Telephone Encounter (Signed)
Please advise if it is okay to give samples it says ask you first.

## 2021-01-28 NOTE — Telephone Encounter (Signed)
We do not have samples of anything close to the 2mg  (only 0.25mg /0.5mg ) so I don't want to deplete supply  Can she call another pharmacy to see if they have?  If can't find--I will switch her to [facility] likely  Let me know

## 2021-01-28 NOTE — Telephone Encounter (Signed)
Lmtcb.

## 2021-02-04 ENCOUNTER — Ambulatory Visit (INDEPENDENT_AMBULATORY_CARE_PROVIDER_SITE_OTHER): Payer: 59 | Admitting: Pharmacist

## 2021-02-04 DIAGNOSIS — E1169 Type 2 diabetes mellitus with other specified complication: Secondary | ICD-10-CM | POA: Diagnosis not present

## 2021-02-04 MED ORDER — SEMAGLUTIDE (2 MG/DOSE) 8 MG/3ML ~~LOC~~ SOPN: 2.0000 mg | PEN_INJECTOR | SUBCUTANEOUS | 2 refills | Status: DC

## 2021-02-04 NOTE — Progress Notes (Signed)
      02/04/2021 Name: Glenda Mitchell       MRN: [id number]       DOB: 1975-08-03     S:  45 Yof Presents for diabetes evaluation, education, and management Patient was referred and last seen by Primary Care Provider on 01/15/21.   Insurance coverage/medication affordability: [employer] HEALTH   Patient reports adherence with medications. Current diabetes medications include: ozempic 2mg , metformin, Current hypertension medications include: losartan Goal 130/80 Current hyperlipidemia medications include: atorvastatin    Patient denies hypoglycemic events.   Patient reported dietary habits: Eats 2-3 meals/day Snacks: cheese Drinks:water, diet drinks Discussed meal planning options and Plate method for healthy eating Avoid sugary drinks and desserts Incorporate balanced protein, non starchy veggies, 1 serving of carbohydrate with each meal Increase water intake Increase physical activity as able   Patient-reported exercise habits: reports she will start walking, having difficulty with neuropathy   Patient reports neuropathy (nerve pain).     O:   Recent Labs       Lab Results  Component Value Date    HGBA1C 7.9 (H) 01/15/2021      Lipid Panel   Labs (Brief)          Component Value Date/Time    CHOL 231 (H) 01/31/2020 1255    TRIG 366 (H) 01/31/2020 1255    HDL 32 (L) 01/31/2020 1255    CHOLHDL 7.2 (H) 01/31/2020 1255    LDLCALC 133 (H) 01/31/2020 1255         Home fasting blood sugars: 140-160   2 hour post-meal/random blood sugars: <225.     Clinical Atherosclerotic Cardiovascular Disease (ASCVD): No    The 10-year ASCVD risk score (Arnett DK, et al., 2019) is: 5.3%   Values used to calculate the score:     Age: 5 years     Sex: Female     Is Non-Hispanic African American: No     Diabetic: Yes     Tobacco smoker: No     Systolic Blood Pressure: 121 mmHg     Is BP treated: Yes     HDL Cholesterol: 32 mg/dL     Total Cholesterol: 231 mg/dL      A/P:   Diabetes 55 currently uncontrolled, however patient has improved glycemic control. Patient is adherent with medication. She has had some difficulty with GI side effects from Ozempic, but is doing well on it now.   -CONTINUE OZEMPIC 2MG  SQ WEEKLY PATIENT TOLERATING She prefers to take at night  Supply issues--however [facility] in [place] was able to fill this month Denies personal and family history of Medullary thyroid cancer (MTC)    -CONTINUE METFORMIN 1G TWICE DAILY   -PATIENT HAS DISCONTINUED HER INSULIN--removed from med list   -A1C HAS IMPROVED TO 7.9% (ONLY 3 WEEKS REFLECTED OF THE OZEMPIC 2MG ) THEREFORE WILL CONTINUE CURRENT MANAGEMENT    -AT FOLLOW UP, IF A1C STILL IS NOT AT GOAL WE WILL CONSIDER ADDING BACK INSULIN OR TRANSITIONING OZEMPIC TO MOUNJARO   -Extensively discussed pathophysiology of diabetes, recommended lifestyle interventions, dietary effects on blood sugar control   -Counseled on s/sx of and management of hypoglycemia   -Next A1C anticipated 3 months.     Written patient instructions provided.  Total time in face to face counseling 30 minutes.    Follow up Pharmacist on 02/04/21   , PharmD, BCPS Clinical Pharmacist, [facility]  II Phone [phone]

## 2021-02-05 NOTE — Telephone Encounter (Signed)
Patient was able to get prescription for Ozempic filled.

## 2021-02-17 ENCOUNTER — Ambulatory Visit: Payer: 59 | Admitting: Family

## 2021-03-06 ENCOUNTER — Other Ambulatory Visit: Payer: Self-pay | Admitting: Family

## 2021-03-17 ENCOUNTER — Telehealth: Payer: Self-pay | Admitting: Family

## 2021-03-17 MED ORDER — OSELTAMIVIR PHOSPHATE 75 MG PO CAPS: 75.0000 mg | ORAL_CAPSULE | Freq: Every day | ORAL | 0 refills | Status: DC

## 2021-03-17 NOTE — Telephone Encounter (Signed)
Patient aware and verbalized understanding. °

## 2021-03-17 NOTE — Telephone Encounter (Signed)
I have sent in Tamiflu 75 mg daily for the next 10 days. If she develops symptoms start BID.

## 2021-03-17 NOTE — Telephone Encounter (Signed)
Emailed

## 2021-03-17 NOTE — Telephone Encounter (Signed)
(  Kids have the flu. [facility]. Mom wants tamiflu to prevent flu. Does she ntbs)

## 2021-03-26 ENCOUNTER — Other Ambulatory Visit: Payer: Self-pay | Admitting: Family

## 2021-03-29 ENCOUNTER — Other Ambulatory Visit: Payer: Self-pay | Admitting: Family

## 2021-03-29 DIAGNOSIS — E785 Hyperlipidemia, unspecified: Secondary | ICD-10-CM

## 2021-04-20 ENCOUNTER — Ambulatory Visit (INDEPENDENT_AMBULATORY_CARE_PROVIDER_SITE_OTHER): Payer: 59 | Admitting: Family

## 2021-04-20 ENCOUNTER — Encounter: Payer: Self-pay | Admitting: Family

## 2021-04-20 VITALS — BP 126/78 | HR 78 | Temp 98.0°F | Ht 64.0 in | Wt 299.4 lb

## 2021-04-20 DIAGNOSIS — K219 Gastro-esophageal reflux disease without esophagitis: Secondary | ICD-10-CM | POA: Diagnosis not present

## 2021-04-20 DIAGNOSIS — E785 Hyperlipidemia, unspecified: Secondary | ICD-10-CM

## 2021-04-20 DIAGNOSIS — E1169 Type 2 diabetes mellitus with other specified complication: Secondary | ICD-10-CM

## 2021-04-20 DIAGNOSIS — F411 Generalized anxiety disorder: Secondary | ICD-10-CM | POA: Diagnosis not present

## 2021-04-20 DIAGNOSIS — E559 Vitamin D deficiency, unspecified: Secondary | ICD-10-CM

## 2021-04-20 DIAGNOSIS — E282 Polycystic ovarian syndrome: Secondary | ICD-10-CM | POA: Diagnosis not present

## 2021-04-20 LAB — BAYER DCA HB A1C WAIVED: HB A1C (BAYER DCA - WAIVED): 7.2 % — ABNORMAL HIGH (ref 4.8–5.6)

## 2021-04-20 NOTE — Patient Instructions (Signed)

## 2021-04-20 NOTE — Progress Notes (Signed)
Subjective:    Patient ID: Glenda Mitchell, female    DOB: Oct 22, 1975, 45 y.o.   MRN: [id number]  Chief Complaint  Patient presents with   Medical Management of Chronic Issues   PT presents to the office today for chronic follow up. She is followed by GYN annually for PCOS. She takes provera every 3 months to bring on her menstrual cycle.   She is currently taking Ozempic  2 mg and has lost 13 lbs since July.  Diabetes She presents for her follow-up diabetic visit. She has type 2 diabetes mellitus. Hypoglycemia symptoms include nervousness/anxiousness. Pertinent negatives for diabetes include no blurred vision and no foot paresthesias. Symptoms are stable. Risk factors for coronary artery disease include diabetes mellitus, hypertension, dyslipidemia, obesity and sedentary lifestyle. She is following a generally unhealthy diet. Her overall blood glucose range is 130-140 mg/dl. Eye exam is current.  Gastroesophageal Reflux She complains of belching and heartburn. This is a chronic problem. The current episode started more than 1 year ago. The problem occurs occasionally. Risk factors include obesity. She has tried a PPI for the symptoms. The treatment provided moderate relief.  Hyperlipidemia This is a chronic problem. The current episode started more than 1 year ago. Exacerbating diseases include obesity. Current antihyperlipidemic treatment includes statins. The current treatment provides moderate improvement of lipids. Risk factors for coronary artery disease include dyslipidemia, diabetes mellitus, hypertension, a sedentary lifestyle and post-menopausal.  Anxiety Presents for follow-up visit. Symptoms include depressed mood, excessive worry, irritability, nervous/anxious behavior and palpitations. Symptoms occur most days. The severity of symptoms is moderate.       Review of Systems  Constitutional:  Positive for irritability.  Eyes:  Negative for blurred vision.  Cardiovascular:   Positive for palpitations.  Gastrointestinal:  Positive for heartburn.  Psychiatric/Behavioral:  The patient is nervous/anxious.   All other systems reviewed and are negative.     Objective:   Physical Exam Vitals reviewed.  Constitutional:      General: She is not in acute distress.    Appearance: She is well-developed. She is obese.  HENT:     Head: Normocephalic and atraumatic.     Right Ear: Tympanic membrane normal.     Left Ear: Tympanic membrane normal.  Eyes:     Pupils: Pupils are equal, round, and reactive to light.  Neck:     Thyroid: No thyromegaly.  Cardiovascular:     Rate and Rhythm: Normal rate and regular rhythm.     Heart sounds: Normal heart sounds. No murmur heard. Pulmonary:     Effort: Pulmonary effort is normal. No respiratory distress.     Breath sounds: Normal breath sounds. No wheezing.  Abdominal:     General: Bowel sounds are normal. There is no distension.     Palpations: Abdomen is soft.     Tenderness: There is no abdominal tenderness.  Musculoskeletal:        General: No tenderness. Normal range of motion.     Cervical back: Normal range of motion and neck supple.  Skin:    General: Skin is warm and dry.  Neurological:     Mental Status: She is alert and oriented to person, place, and time.     Cranial Nerves: No cranial nerve deficit.     Deep Tendon Reflexes: Reflexes are normal and symmetric.  Psychiatric:        Behavior: Behavior normal.        Thought Content: Thought content normal.  Judgment: Judgment normal.    Diabetic Foot Exam - Simple   Simple Foot Form Diabetic Foot exam was performed with the following findings: Yes 04/20/2021 10:50 AM  Visual Inspection No deformities, no ulcerations, no other skin breakdown bilaterally: Yes Sensation Testing Intact to touch and monofilament testing bilaterally: Yes Pulse Check Posterior Tibialis and Dorsalis pulse intact bilaterally: Yes Comments      BP 126/78    Pulse  78    Temp 98 F (36.7 C) (Temporal)    Ht 5' 4"  (1.626 m)    Wt 299 lb 6.4 oz (135.8 kg)    BMI 51.39 kg/m      Assessment & Plan:  Glenda Mitchell comes in today with chief complaint of Medical Management of Chronic Issues   Diagnosis and orders addressed:  1. Gastroesophageal reflux disease without esophagitis - CMP14+EGFR - CBC with Differential/Platelet  2. Type 2 diabetes mellitus with other specified complication, without long-term current use of insulin (HCC) - Bayer DCA Hb A1c Waived - CMP14+EGFR - CBC with Differential/Platelet  3. Hyperlipidemia associated with type 2 diabetes mellitus (HCC) - CMP14+EGFR - CBC with Differential/Platelet  4. PCOS (polycystic ovarian syndrome) - CMP14+EGFR - CBC with Differential/Platelet  5. GAD (generalized anxiety disorder) - CMP14+EGFR - CBC with Differential/Platelet  6. Vitamin D deficiency - CMP14+EGFR - CBC with Differential/Platelet   Labs pending Health Maintenance reviewed Diet and exercise encouraged  Follow up plan: 3 months    Evelina Dun, FNP

## 2021-04-21 LAB — CMP14+EGFR
ALT: 14 IU/L (ref 0–32)
AST: 13 IU/L (ref 0–40)
Albumin/Globulin Ratio: 1.6 (ref 1.2–2.2)
Albumin: 4.4 g/dL (ref 3.8–4.8)
Alkaline Phosphatase: 76 IU/L (ref 44–121)
BUN/Creatinine Ratio: 19 (ref 9–23)
BUN: 11 mg/dL (ref 6–24)
Bilirubin Total: 0.3 mg/dL (ref 0.0–1.2)
CO2: 24 mmol/L (ref 20–29)
Calcium: 10 mg/dL (ref 8.7–10.2)
Chloride: 101 mmol/L (ref 96–106)
Creatinine, Ser: 0.57 mg/dL (ref 0.57–1.00)
Globulin, Total: 2.7 g/dL (ref 1.5–4.5)
Glucose: 127 mg/dL — ABNORMAL HIGH (ref 70–99)
Potassium: 4.5 mmol/L (ref 3.5–5.2)
Sodium: 139 mmol/L (ref 134–144)
Total Protein: 7.1 g/dL (ref 6.0–8.5)
eGFR: 116 mL/min/{1.73_m2} (ref >59)

## 2021-04-21 LAB — CBC WITH DIFFERENTIAL/PLATELET
Basophils Absolute: 0 10*3/uL (ref 0.0–0.2)
Basos: 0 %
EOS (ABSOLUTE): 0.3 10*3/uL (ref 0.0–0.4)
Eos: 3 %
Hematocrit: 40 % (ref 34.0–46.6)
Hemoglobin: 13 g/dL (ref 11.1–15.9)
Immature Grans (Abs): 0 10*3/uL (ref 0.0–0.1)
Immature Granulocytes: 0 %
Lymphocytes Absolute: 3.1 10*3/uL (ref 0.7–3.1)
Lymphs: 32 %
MCH: 28 pg (ref 26.6–33.0)
MCHC: 32.5 g/dL (ref 31.5–35.7)
MCV: 86 fL (ref 79–97)
Monocytes Absolute: 0.6 10*3/uL (ref 0.1–0.9)
Monocytes: 6 %
Neutrophils Absolute: 5.6 10*3/uL (ref 1.4–7.0)
Neutrophils: 59 %
Platelets: 355 10*3/uL (ref 150–450)
RBC: 4.64 x10E6/uL (ref 3.77–5.28)
RDW: 13.8 % (ref 11.7–15.4)
WBC: 9.5 10*3/uL (ref 3.4–10.8)

## 2021-05-10 ENCOUNTER — Telehealth: Payer: Self-pay | Admitting: *Deleted

## 2021-05-10 DIAGNOSIS — E1169 Type 2 diabetes mellitus with other specified complication: Secondary | ICD-10-CM

## 2021-05-10 NOTE — Telephone Encounter (Signed)
Glenda Mitchell (Key: [id number]) Rx #: [phone] Ozempic (2 MG/DOSE) 8MG pen-injectors  [employer] plan - New insurance per pt = bringing a copy of card by here today   Message from [employer] Prior Authorization Department is unable to review this request at this time. Our records indicate that this member is not eligible for prior authorizations or that the review of prior authorizations is not delegated to [employer] [facility] Prior Authorization Department. Please review the member's insurance demographics and submit to the appropriate party. Thank you in advance.

## 2021-05-10 NOTE — Telephone Encounter (Signed)
Renewed with C[facility] -   Wait for Determination Please wait for [employer] to return a determination Sent to plan  1/9-jhb

## 2021-05-11 NOTE — Telephone Encounter (Signed)
Message from Plan PA Case: [id number], Status: Approved, Coverage Starts on: 05/10/2021 12:00 AM, Coverage Ends on: 05/10/2022 12:00 AM. Questions? Contact [phone].  Pharmacy and patient aware

## 2021-05-11 NOTE — Telephone Encounter (Signed)
Form completed and faxed with office notes.

## 2021-05-20 ENCOUNTER — Telehealth (INDEPENDENT_AMBULATORY_CARE_PROVIDER_SITE_OTHER): Payer: Self-pay | Admitting: Family Medicine

## 2021-05-20 ENCOUNTER — Encounter: Payer: Self-pay | Admitting: Family Medicine

## 2021-05-20 NOTE — Progress Notes (Signed)
Patient was in the middle of the vascular game and could not do the call right now and she will just reschedule tomorrow

## 2021-05-21 ENCOUNTER — Other Ambulatory Visit: Payer: Self-pay | Admitting: Family

## 2021-05-21 ENCOUNTER — Telehealth (INDEPENDENT_AMBULATORY_CARE_PROVIDER_SITE_OTHER): Payer: 59 | Admitting: Family Medicine

## 2021-05-21 ENCOUNTER — Encounter: Payer: Self-pay | Admitting: Family Medicine

## 2021-05-21 DIAGNOSIS — Z1231 Encounter for screening mammogram for malignant neoplasm of breast: Secondary | ICD-10-CM

## 2021-05-21 DIAGNOSIS — H109 Unspecified conjunctivitis: Secondary | ICD-10-CM | POA: Diagnosis not present

## 2021-05-21 MED ORDER — ERYTHROMYCIN 5 MG/GM OP OINT: 1.0000 "application " | TOPICAL_OINTMENT | Freq: Four times a day (QID) | OPHTHALMIC | 0 refills | Status: AC

## 2021-05-21 NOTE — Progress Notes (Signed)
° °Virtual Visit via Video note ° °I connected with Glenda Mitchell on 05/21/21 at 12:08 PM by video and verified that I am speaking with the correct person using two identifiers. Glenda Mitchell is currently located in her vehicle and husband is currently with her during visit. The provider, BRITNEY F JOYCE, FNP is located in their office at time of visit. ° °I discussed the limitations, risks, security and privacy concerns of performing an evaluation and management service by video and the availability of in person appointments. I also discussed with the patient that there may be a patient responsible charge related to this service. The patient expressed understanding and agreed to proceed. ° °Subjective: °PCP: Mitchell, Glenda A, FNP ° °Chief Complaint  °Patient presents with  ° Conjunctivitis  ° °Patient reports her left eye is itching, red, and swollen. This started three days ago. She has been putting drops in her eyes from when her kids had pinkeye, but feels like it made the redness worse and it burned. Today she has been using sterile saline.  ° ° °ROS: Per HPI ° °Current Outpatient Medications:  °  allopurinol (ZYLOPRIM) 100 MG tablet, Take 2 tablets by mouth once daily, Disp: 60 tablet, Rfl: 0 °  atorvastatin (LIPITOR) 20 MG tablet, Take 1 tablet by mouth once daily, Disp: 90 tablet, Rfl: 0 °  Blood Glucose Monitoring Suppl (ONETOUCH VERIO) w/Device KIT, 1 kit by Does not apply route 3 (three) times daily before meals., Disp: 1 kit, Rfl: 11 °  Continuous Blood Gluc Receiver (FREESTYLE LIBRE 2 READER) DEVI, USE TO TEST BLOOD SUGAR UP TO SIX TIMES DAILY, Disp: 1 each, Rfl: 0 °  Continuous Blood Gluc Sensor (FREESTYLE LIBRE 2 SENSOR) MISC, USE 1 SENSOR TO CHECK GLUCOSE UP TO SIX TIMES DAILY, Disp: 2 each, Rfl: 2 °  Diclofenac Sodium CR 100 MG 24 hr tablet, Take 1 tablet (100 mg total) by mouth daily., Disp: 60 tablet, Rfl: 5 °  EQ ALLERGY RELIEF, CETIRIZINE, 10 MG tablet, TAKE 1 TABLET BY MOUTH ONCE DAILY  AS NEEDED, Disp: 90 tablet, Rfl: 1 °  escitalopram (LEXAPRO) 10 MG tablet, Take 1 tablet (10 mg total) by mouth daily., Disp: 90 tablet, Rfl: 3 °  esomeprazole (NEXIUM) 40 MG capsule, Take 1 capsule (40 mg total) by mouth daily., Disp: 90 capsule, Rfl: 2 °  fluticasone (FLONASE) 50 MCG/ACT nasal spray, Place 2 sprays into both nostrils daily., Disp: 16 g, Rfl: 6 °  glucose blood (ONETOUCH VERIO) test strip, Test BS daily Dx E11.9, Disp: 100 each, Rfl: 11 °  Lancets (ONETOUCH ULTRASOFT) lancets, Use as instructed, Disp: 100 each, Rfl: 12 °  losartan (COZAAR) 50 MG tablet, Take 1 tablet (50 mg total) by mouth daily., Disp: 90 tablet, Rfl: 3 °  medroxyPROGESTERone (PROVERA) 10 MG tablet, TAKE 1 TABLET BY MOUTH ONCE DAILY FOR 10 DAYS EVERY 3 MONTHS, Disp: , Rfl:  °  metFORMIN (GLUCOPHAGE) 1000 MG tablet, Take 1 tablet by mouth twice daily, Disp: 180 tablet, Rfl: 0 °  Semaglutide, 2 MG/DOSE, 8 MG/3ML SOPN, Inject 2 mg as directed once a week., Disp: 3 mL, Rfl: 2 ° °Allergies  °Allergen Reactions  ° Clarithromycin Other (See Comments)  °  Chest pain and SOB  ° Ivp Dye [Iodinated Contrast Media]   °  Lip swelling when receiving dye for procedure  ° Jardiance [Empagliflozin]   °  Yeast infection °  ° °Past Medical History:  °Diagnosis Date  ° Diabetes mellitus without   without complication ([place])    Gout    Hypertension     Observations/Objective: Physical Exam Constitutional:      General: She is not in acute distress.    Appearance: Normal appearance. She is not ill-appearing or toxic-appearing.  Eyes:     General: No scleral icterus.       Right eye: No discharge.        Left eye: No discharge.     Conjunctiva/sclera:     Left eye: Left conjunctiva is injected.  Pulmonary:     Effort: Pulmonary effort is normal. No respiratory distress.  Neurological:     Mental Status: She is alert and oriented to person, place, and time.  Psychiatric:        Mood and Affect: Mood normal.        Behavior: Behavior normal.         Thought Content: Thought content normal.        Judgment: Judgment normal.   Assessment and Plan: 1. Bacterial conjunctivitis of left eye - erythromycin ophthalmic ointment; Place 1 application into the left eye 4 (four) times daily for 7 days.  Dispense: 3.5 g; Refill: 0  Follow Up Instructions:   I discussed the assessment and treatment plan with the patient. The patient was provided an opportunity to ask questions and all were answered. The patient agreed with the plan and demonstrated an understanding of the instructions.   The patient was advised to call back or seek an in-person evaluation if the symptoms worsen or if the condition fails to improve as anticipated.  The above assessment and management plan was discussed with the patient. The patient verbalized understanding of and has agreed to the management plan. Patient is aware to call the clinic if symptoms persist or worsen. Patient is aware when to return to the clinic for a follow-up visit. Patient educated on when it is appropriate to go to the emergency department.   Time call ended: 12:19 PM  I provided 11 minutes of face-to-face time during this encounter.   Hendricks Limes, MSN, APRN, FNP-C [facility] 05/21/21

## 2021-06-08 ENCOUNTER — Other Ambulatory Visit: Payer: Self-pay | Admitting: Family

## 2021-06-08 DIAGNOSIS — E119 Type 2 diabetes mellitus without complications: Secondary | ICD-10-CM

## 2021-06-08 DIAGNOSIS — K219 Gastro-esophageal reflux disease without esophagitis: Secondary | ICD-10-CM

## 2021-06-09 ENCOUNTER — Telehealth: Payer: Self-pay | Admitting: Family

## 2021-06-09 NOTE — Telephone Encounter (Signed)
Your request has been n/a PA Case: [id number], Status: Closed, Closed Reason Code: FM Product not covered by this plan. Prior Authorization not available.,

## 2021-06-09 NOTE — Telephone Encounter (Signed)
Key: [id number] - PA Case ID: [id number] - Rx #: [id number] Need help? Call us at [phone] Status Sent to [facility] Esomeprazole Magnesium 40MG  dr capsules

## 2021-06-17 MED ORDER — OMEPRAZOLE 40 MG PO CPDR: 40.0000 mg | DELAYED_RELEASE_CAPSULE | Freq: Every day | ORAL | 1 refills | Status: DC

## 2021-06-17 NOTE — Telephone Encounter (Signed)
Was talking to patient and I could not hear her.  Hung up and called back and NA

## 2021-06-17 NOTE — Telephone Encounter (Signed)
Pt called to get update on status of PA for Esomeprazole.Marland Kitchen  Pt aware that PA was closed for this medicine per nurse notes.  Pt wants to know if there is something else that PCP can call in for pt that insurance will cover?  Please advise and call patient.

## 2021-06-17 NOTE — Telephone Encounter (Signed)
Prilosec 40 mg Prescription sent to pharmacy.

## 2021-06-29 ENCOUNTER — Other Ambulatory Visit: Payer: Self-pay | Admitting: Family

## 2021-06-29 DIAGNOSIS — E785 Hyperlipidemia, unspecified: Secondary | ICD-10-CM

## 2021-06-29 DIAGNOSIS — E1169 Type 2 diabetes mellitus with other specified complication: Secondary | ICD-10-CM

## 2021-07-06 ENCOUNTER — Other Ambulatory Visit: Payer: Self-pay | Admitting: Family Medicine

## 2021-07-06 DIAGNOSIS — Z0000 Encounter for general adult medical examination without abnormal findings: Secondary | ICD-10-CM

## 2021-07-07 ENCOUNTER — Encounter: Payer: Self-pay | Admitting: Family

## 2021-07-07 ENCOUNTER — Other Ambulatory Visit: Payer: Self-pay | Admitting: Family Medicine

## 2021-07-07 ENCOUNTER — Ambulatory Visit (INDEPENDENT_AMBULATORY_CARE_PROVIDER_SITE_OTHER): Payer: 59 | Admitting: Family

## 2021-07-07 ENCOUNTER — Inpatient Hospital Stay: Admission: RE | Admit: 2021-07-07 | Payer: Self-pay | Source: Ambulatory Visit

## 2021-07-07 ENCOUNTER — Ambulatory Visit: Payer: 59

## 2021-07-07 VITALS — BP 132/87 | HR 108 | Temp 97.7°F

## 2021-07-07 DIAGNOSIS — F411 Generalized anxiety disorder: Secondary | ICD-10-CM

## 2021-07-07 DIAGNOSIS — E282 Polycystic ovarian syndrome: Secondary | ICD-10-CM | POA: Diagnosis not present

## 2021-07-07 DIAGNOSIS — F320 Major depressive disorder, single episode, mild: Secondary | ICD-10-CM

## 2021-07-07 DIAGNOSIS — Z0001 Encounter for general adult medical examination with abnormal findings: Secondary | ICD-10-CM | POA: Diagnosis not present

## 2021-07-07 DIAGNOSIS — I10 Essential (primary) hypertension: Secondary | ICD-10-CM

## 2021-07-07 DIAGNOSIS — E559 Vitamin D deficiency, unspecified: Secondary | ICD-10-CM

## 2021-07-07 DIAGNOSIS — K219 Gastro-esophageal reflux disease without esophagitis: Secondary | ICD-10-CM

## 2021-07-07 DIAGNOSIS — Z0000 Encounter for general adult medical examination without abnormal findings: Secondary | ICD-10-CM

## 2021-07-07 DIAGNOSIS — E1169 Type 2 diabetes mellitus with other specified complication: Secondary | ICD-10-CM | POA: Diagnosis not present

## 2021-07-07 DIAGNOSIS — E119 Type 2 diabetes mellitus without complications: Secondary | ICD-10-CM

## 2021-07-07 LAB — BAYER DCA HB A1C WAIVED: HB A1C (BAYER DCA - WAIVED): 7.5 % — ABNORMAL HIGH (ref 4.8–5.6)

## 2021-07-07 LAB — LIPID PANEL

## 2021-07-07 MED ORDER — MEDROXYPROGESTERONE ACETATE 150 MG/ML IM SUSP: 150.0000 mg | Freq: Once | INTRAMUSCULAR | 4 refills | Status: DC

## 2021-07-07 MED ORDER — SEMAGLUTIDE (2 MG/DOSE) 8 MG/3ML ~~LOC~~ SOPN: 2.0000 mg | PEN_INJECTOR | SUBCUTANEOUS | 2 refills | Status: DC

## 2021-07-07 MED ORDER — MEDROXYPROGESTERONE ACETATE 150 MG/ML IM SUSP
150.0000 mg | Freq: Once | INTRAMUSCULAR | Status: AC
  Administered 2021-07-07: 150 mg via INTRAMUSCULAR

## 2021-07-07 MED ORDER — ATORVASTATIN CALCIUM 20 MG PO TABS: 20.0000 mg | ORAL_TABLET | Freq: Every day | ORAL | 3 refills | Status: DC

## 2021-07-07 MED ORDER — OMEPRAZOLE 40 MG PO CPDR: 40.0000 mg | DELAYED_RELEASE_CAPSULE | Freq: Every day | ORAL | 1 refills | Status: DC

## 2021-07-07 MED ORDER — METFORMIN HCL 1000 MG PO TABS: 1000.0000 mg | ORAL_TABLET | Freq: Two times a day (BID) | ORAL | 2 refills | Status: DC

## 2021-07-07 MED ORDER — LOSARTAN POTASSIUM 50 MG PO TABS: 50.0000 mg | ORAL_TABLET | Freq: Every day | ORAL | 3 refills | Status: DC

## 2021-07-07 MED ORDER — ESCITALOPRAM OXALATE 10 MG PO TABS: 10.0000 mg | ORAL_TABLET | Freq: Every day | ORAL | 3 refills | Status: DC

## 2021-07-07 NOTE — Addendum Note (Signed)
Addended by: Julious Payer D on: 07/07/2021 04:39 PM ? ? Modules accepted: Orders ? ?

## 2021-07-07 NOTE — Patient Instructions (Signed)

## 2021-07-07 NOTE — Progress Notes (Signed)
? ?Subjective:  ? ? Patient ID: Glenda Mitchell, female    DOB: 10-30-1975, 46 y.o.   MRN: [id number] ? ?Chief Complaint  ?Patient presents with  ? Annual Exam  ? ?Pt presents to the office today for CPE without pap. She is followed by GYN annually for PCOS. She takes provera every 3 months to bring on her menstrual cycle. ?  ?She is currently taking Ozempic  2 mg and has lost 11 lbs since July.  ?Gastroesophageal Reflux ?She complains of belching and heartburn. This is a chronic problem. The current episode started more than 1 year ago. The problem occurs occasionally. Risk factors include obesity. She has tried a PPI for the symptoms. The treatment provided moderate relief.  ?Hyperlipidemia ?This is a chronic problem. The current episode started more than 1 year ago. The problem is controlled. Exacerbating diseases include obesity. Current antihyperlipidemic treatment includes statins. The current treatment provides moderate improvement of lipids. Risk factors for coronary artery disease include diabetes mellitus, hypertension, a sedentary lifestyle, family history and dyslipidemia.  ?Diabetes ?She presents for her follow-up diabetic visit. She has type 2 diabetes mellitus. Hypoglycemia symptoms include nervousness/anxiousness. Associated symptoms include foot paresthesias. Pertinent negatives for diabetes include no blurred vision. Symptoms are stable. Diabetic complications include peripheral neuropathy. Risk factors for coronary artery disease include dyslipidemia, diabetes mellitus, hypertension, sedentary lifestyle and post-menopausal. She is following a generally unhealthy diet. Her overall blood glucose range is 140-180 mg/dl. An ACE inhibitor/angiotensin II receptor blocker is being taken.  ?Anxiety ?Presents for follow-up visit. Symptoms include depressed mood, excessive worry, irritability, nervous/anxious behavior and restlessness.  ? ? ? ? ? ?Review of Systems  ?Constitutional:  Positive for  irritability.  ?Eyes:  Negative for blurred vision.  ?Gastrointestinal:  Positive for heartburn.  ?Psychiatric/Behavioral:  The patient is nervous/anxious.   ?All other systems reviewed and are negative. ? ?Family History  ?Problem Relation Age of Onset  ? Diabetes Mother   ? Cancer Mother   ?     breast/lung  ? Heart attack Father   ? Hypertension Father   ? Diabetes Sister   ? Heart disease Sister   ? Cancer Maternal Grandmother   ?     lung  ? Kidney disease Sister   ? ?Social History  ? ?Socioeconomic History  ? Marital status: Married  ?  Spouse name: Not on file  ? Number of children: 3  ? Years of education: Not on file  ? Highest education level: Not on file  ?Occupational History  ? Not on file  ?Tobacco Use  ? Smoking status: Never  ? Smokeless tobacco: Never  ?Vaping Use  ? Vaping Use: Never used  ?Substance and Sexual Activity  ? Alcohol use: Never  ? Drug use: Never  ? Sexual activity: Yes  ?  Birth control/protection: None  ?Other Topics Concern  ? Not on file  ?Social History Narrative  ? Not on file  ? ?Social Determinants of Health  ? ?Financial Resource Strain: Not on file  ?Food Insecurity: Not on file  ?Transportation Needs: Not on file  ?Physical Activity: Not on file  ?Stress: Not on file  ?Social Connections: Not on file  ? ? ?   ?Objective:  ? Physical Exam ?Vitals reviewed.  ?Constitutional:   ?   General: She is not in acute distress. ?   Appearance: She is well-developed. She is obese.  ?HENT:  ?   Head: Normocephalic and atraumatic.  ?   Right  Ear: Tympanic membrane normal.  ?   Left Ear: Tympanic membrane normal.  ?Eyes:  ?   Pupils: Pupils are equal, round, and reactive to light.  ?Neck:  ?   Thyroid: No thyromegaly.  ?Cardiovascular:  ?   Rate and Rhythm: Normal rate and regular rhythm.  ?   Heart sounds: Normal heart sounds. No murmur heard. ?Pulmonary:  ?   Effort: Pulmonary effort is normal. No respiratory distress.  ?   Breath sounds: Normal breath sounds. No wheezing.  ?Abdominal:   ?   General: Bowel sounds are normal. There is no distension.  ?   Palpations: Abdomen is soft.  ?   Tenderness: There is no abdominal tenderness.  ?Musculoskeletal:     ?   General: No tenderness. Normal range of motion.  ?   Cervical back: Normal range of motion and neck supple.  ?Skin: ?   General: Skin is warm and dry.  ?Neurological:  ?   Mental Status: She is alert and oriented to person, place, and time.  ?   Cranial Nerves: No cranial nerve deficit.  ?   Deep Tendon Reflexes: Reflexes are normal and symmetric.  ?Psychiatric:     ?   Behavior: Behavior normal.     ?   Thought Content: Thought content normal.     ?   Judgment: Judgment normal.  ? ? ? ? ?BP 132/87   Pulse (!) 108   Temp 97.7 ?F (36.5 ?C) (Temporal)  ? ?   ?Assessment & Plan:  ?Glenda Mitchell comes in today with chief complaint of Annual Exam ? ? ?Diagnosis and orders addressed: ? ?1. Annual physical exam ?- Thyroid Panel With TSH ?- Lipid panel ?- CMP14+EGFR ?- CBC with Differential/Platelet ? ?2. Type 2 diabetes mellitus with other specified complication, without long-term current use of insulin ([place]) ?- Bayer DCA Hb A1c Waived ?- Semaglutide, 2 MG/DOSE, 8 MG/3ML SOPN; Inject 2 mg as directed once a week.  Dispense: 3 mL; Refill: 2 ? ?3. Hyperlipidemia associated with type 2 diabetes mellitus (HCC) ?- atorvastatin (LIPITOR) 20 MG tablet; Take 1 tablet (20 mg total) by mouth daily.  Dispense: 90 tablet; Refill: 3 ? ?4. Essential hypertension ?- losartan (COZAAR) 50 MG tablet; Take 1 tablet (50 mg total) by mouth daily.  Dispense: 90 tablet; Refill: 3 ? ?5. GAD (generalized anxiety disorder) ?- escitalopram (LEXAPRO) 10 MG tablet; Take 1 tablet (10 mg total) by mouth daily.  Dispense: 90 tablet; Refill: 3 ? ?6. Depression, major, single episode, mild (HCC) ?- escitalopram (LEXAPRO) 10 MG tablet; Take 1 tablet (10 mg total) by mouth daily.  Dispense: 90 tablet; Refill: 3 ? ?7. Diabetes mellitus without complication ([place]) ? ?- metFORMIN  (GLUCOPHAGE) 1000 MG tablet; Take 1 tablet (1,000 mg total) by mouth 2 (two) times daily.  Dispense: 180 tablet; Refill: 2 ? ?8. Gastroesophageal reflux disease without esophagitis ?- omeprazole (PRILOSEC) 40 MG capsule; Take 1 capsule (40 mg total) by mouth daily.  Dispense: 90 capsule; Refill: 1 ? ?9. Vitamin D deficiency ? ?10. PCOS (polycystic ovarian syndrome) ?Start depo provera ?- medroxyPROGESTERone (DEPO-PROVERA) 150 MG/ML injection; Inject 1 mL (150 mg total) into the muscle once for 1 dose.  Dispense: 1 mL; Refill: 4 ? ?Labs pending ?Health Maintenance reviewed ?Diet and exercise encouraged ? ?Follow up plan: ?3 months  ? ? ?Evelina Dun, FNP ? ? ? ?

## 2021-07-08 ENCOUNTER — Telehealth: Payer: Self-pay | Admitting: *Deleted

## 2021-07-08 LAB — CBC WITH DIFFERENTIAL/PLATELET
Basophils Absolute: 0 10*3/uL (ref 0.0–0.2)
Basos: 1 %
EOS (ABSOLUTE): 0.2 10*3/uL (ref 0.0–0.4)
Eos: 3 %
Hematocrit: 37.8 % (ref 34.0–46.6)
Hemoglobin: 12.4 g/dL (ref 11.1–15.9)
Immature Grans (Abs): 0 10*3/uL (ref 0.0–0.1)
Immature Granulocytes: 0 %
Lymphocytes Absolute: 2.6 10*3/uL (ref 0.7–3.1)
Lymphs: 30 %
MCH: 27.8 pg (ref 26.6–33.0)
MCHC: 32.8 g/dL (ref 31.5–35.7)
MCV: 85 fL (ref 79–97)
Monocytes Absolute: 0.5 10*3/uL (ref 0.1–0.9)
Monocytes: 6 %
Neutrophils Absolute: 5.3 10*3/uL (ref 1.4–7.0)
Neutrophils: 60 %
Platelets: 301 10*3/uL (ref 150–450)
RBC: 4.46 x10E6/uL (ref 3.77–5.28)
RDW: 14.7 % (ref 11.7–15.4)
WBC: 8.7 10*3/uL (ref 3.4–10.8)

## 2021-07-08 LAB — CMP14+EGFR
ALT: 14 IU/L (ref 0–32)
AST: 12 IU/L (ref 0–40)
Albumin/Globulin Ratio: 1.6 (ref 1.2–2.2)
Albumin: 4.1 g/dL (ref 3.8–4.8)
Alkaline Phosphatase: 66 IU/L (ref 44–121)
BUN/Creatinine Ratio: 28 — ABNORMAL HIGH (ref 9–23)
BUN: 16 mg/dL (ref 6–24)
Bilirubin Total: 0.3 mg/dL (ref 0.0–1.2)
CO2: 26 mmol/L (ref 20–29)
Calcium: 9.3 mg/dL (ref 8.7–10.2)
Chloride: 98 mmol/L (ref 96–106)
Creatinine, Ser: 0.57 mg/dL (ref 0.57–1.00)
Globulin, Total: 2.6 g/dL (ref 1.5–4.5)
Glucose: 158 mg/dL — ABNORMAL HIGH (ref 70–99)
Potassium: 4.6 mmol/L (ref 3.5–5.2)
Sodium: 138 mmol/L (ref 134–144)
Total Protein: 6.7 g/dL (ref 6.0–8.5)
eGFR: 116 mL/min/{1.73_m2} (ref >59)

## 2021-07-08 LAB — THYROID PANEL WITH TSH
Free Thyroxine Index: 1.7 (ref 1.2–4.9)
T3 Uptake Ratio: 24 % (ref 24–39)
T4, Total: 7.2 ug/dL (ref 4.5–12.0)
TSH: 1.81 u[IU]/mL (ref 0.450–4.500)

## 2021-07-08 LAB — LIPID PANEL
Chol/HDL Ratio: 4.1 ratio (ref 0.0–4.4)
Cholesterol, Total: 153 mg/dL (ref 100–199)
HDL: 37 mg/dL — ABNORMAL LOW
LDL Chol Calc (NIH): 72 mg/dL (ref 0–99)
Triglycerides: 275 mg/dL — ABNORMAL HIGH (ref 0–149)
VLDL Cholesterol Cal: 44 mg/dL — ABNORMAL HIGH (ref 5–40)

## 2021-07-08 MED ORDER — DICLOFENAC SODIUM 75 MG PO TBEC: 75.0000 mg | DELAYED_RELEASE_TABLET | Freq: Two times a day (BID) | ORAL | 2 refills | Status: DC

## 2021-07-08 NOTE — Telephone Encounter (Signed)
Fax from [facility] ?RE: Diclofenac 100 mg ER tab 1 QD ?Note: pt says RX was suppose to be sent in to replace this medication due to cost ?Please advise and send in alternative med if appropriate ?

## 2021-07-08 NOTE — Addendum Note (Signed)
Addended by: [name] A on: 07/08/2021 02:04 PM ? ? Modules accepted: Orders ? ?

## 2021-07-08 NOTE — Telephone Encounter (Signed)
Diclofenac 75 mg BID Prescription sent to pharmacy   

## 2021-07-16 IMAGING — DX DG CHEST 2V
2 series · 2 of 2 positions shown · non-contrast
Comparison: None.

CLINICAL DATA: Shortness of breath, cough

EXAM:
CHEST - 2 VIEW

[chest pa]
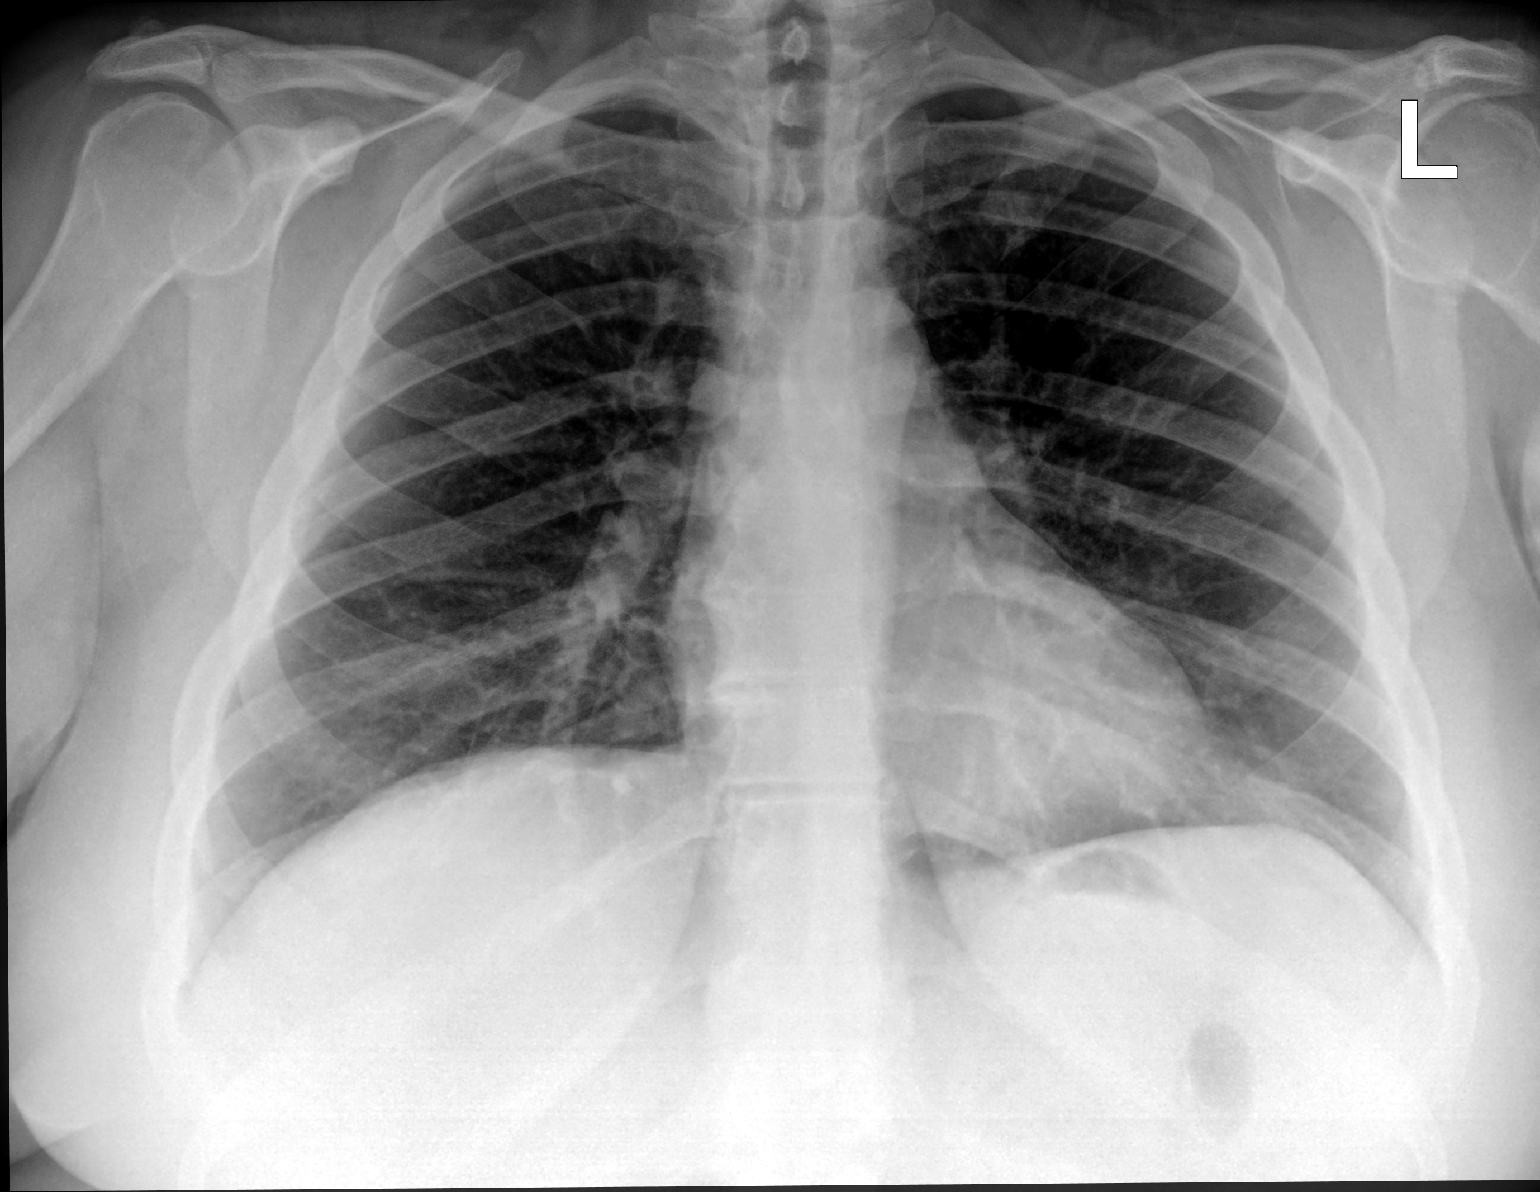

[chest lat]
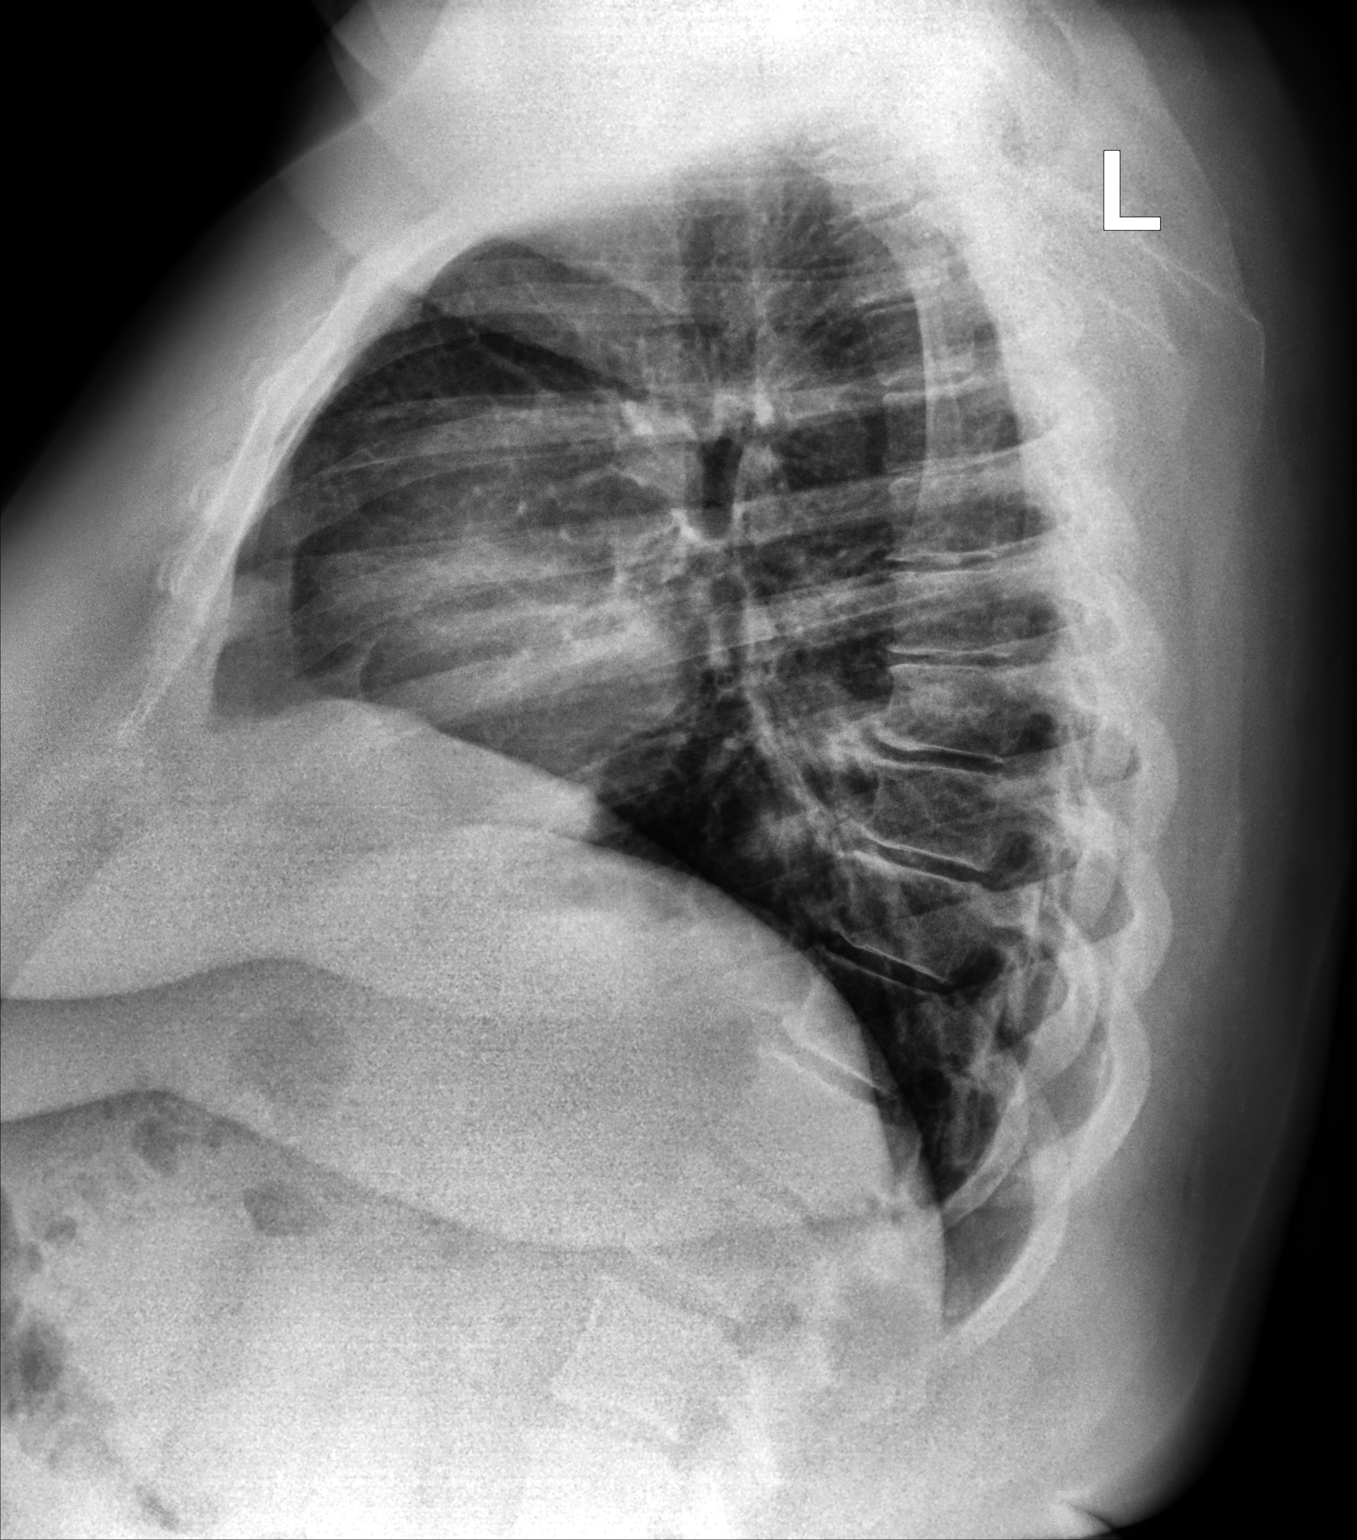

[2 of 2 positions shown; findings below may reference images not displayed]

FINDINGS: The heart size and mediastinal contours are within normal limits.
Both lungs are clear. The visualized skeletal structures are
unremarkable.
IMPRESSION: No active cardiopulmonary disease.

## 2021-07-20 ENCOUNTER — Ambulatory Visit: Payer: 59 | Admitting: Family

## 2021-07-30 ENCOUNTER — Other Ambulatory Visit: Payer: Self-pay | Admitting: Family

## 2021-07-30 DIAGNOSIS — F320 Major depressive disorder, single episode, mild: Secondary | ICD-10-CM

## 2021-07-30 DIAGNOSIS — F411 Generalized anxiety disorder: Secondary | ICD-10-CM

## 2021-07-30 DIAGNOSIS — M1A9XX0 Chronic gout, unspecified, without tophus (tophi): Secondary | ICD-10-CM

## 2021-08-02 ENCOUNTER — Telehealth: Payer: Self-pay | Admitting: Family

## 2021-08-02 ENCOUNTER — Ambulatory Visit (INDEPENDENT_AMBULATORY_CARE_PROVIDER_SITE_OTHER): Payer: 59 | Admitting: Family

## 2021-08-02 ENCOUNTER — Encounter: Payer: Self-pay | Admitting: Family

## 2021-08-02 VITALS — BP 118/72 | HR 80 | Temp 97.0°F | Ht 64.0 in | Wt 302.2 lb

## 2021-08-02 DIAGNOSIS — K5900 Constipation, unspecified: Secondary | ICD-10-CM | POA: Diagnosis not present

## 2021-08-02 DIAGNOSIS — B3731 Acute candidiasis of vulva and vagina: Secondary | ICD-10-CM

## 2021-08-02 DIAGNOSIS — H6992 Unspecified Eustachian tube disorder, left ear: Secondary | ICD-10-CM

## 2021-08-02 DIAGNOSIS — M76891 Other specified enthesopathies of right lower limb, excluding foot: Secondary | ICD-10-CM | POA: Diagnosis not present

## 2021-08-02 MED ORDER — FLUCONAZOLE 150 MG PO TABS: 150.0000 mg | ORAL_TABLET | ORAL | 0 refills | Status: DC | PRN

## 2021-08-02 MED ORDER — FLUTICASONE PROPIONATE 50 MCG/ACT NA SUSP: 2.0000 | Freq: Every day | NASAL | 6 refills | Status: DC

## 2021-08-02 MED ORDER — CETIRIZINE HCL 10 MG PO TABS: 10.0000 mg | ORAL_TABLET | Freq: Every day | ORAL | 11 refills | Status: DC

## 2021-08-02 MED ORDER — POLYETHYLENE GLYCOL 3350 17 GM/SCOOP PO POWD: 17.0000 g | Freq: Every day | ORAL | 1 refills | Status: DC

## 2021-08-02 MED ORDER — PREDNISONE 10 MG (21) PO TBPK: ORAL_TABLET | ORAL | 0 refills | Status: DC

## 2021-08-02 MED ORDER — DICLOFENAC SODIUM 75 MG PO TBEC: 75.0000 mg | DELAYED_RELEASE_TABLET | Freq: Two times a day (BID) | ORAL | 2 refills | Status: DC

## 2021-08-02 NOTE — Progress Notes (Signed)
? ?Subjective:  ? ? Patient ID: Glenda Mitchell, female    DOB: 1975-09-09, 46 y.o.   MRN: [id number] ? ?Chief Complaint  ?Patient presents with  ? Ear Pain  ?  Left ear pain  went to urgent care last week and the gave her abx.   ? Vaginitis  ? Hip Pain  ?  Right hip  ? ?Pt presents to the office today with left ear pain. She went to the Urgent Care on 07/24/21 and was given Augmentin BID for 5 days. She reports she started feeling slightly better, but continued to have ear pain.  ? ?She is now complaining of vaginal itching and discharge.  ?Otalgia  ?There is pain in the left ear. This is a new problem. The current episode started 1 to 4 weeks ago. The problem occurs constantly. The problem has been unchanged. There has been no fever. The pain is at a severity of 7/10. The pain is moderate. Associated symptoms include headaches and a sore throat (on left side). Pertinent negatives include no coughing, ear discharge or rhinorrhea. She has tried NSAIDs and antibiotics for the symptoms. The treatment provided mild relief.  ?Vaginal Itching ?The patient's primary symptoms include genital itching and vaginal discharge. The patient's pertinent negatives include no vaginal bleeding. This is a new problem. The current episode started in the past 7 days. Associated symptoms include constipation, headaches and a sore throat (on left side). She has tried antifungals for the symptoms. The treatment provided mild relief.  ?Hip Pain  ?The incident occurred more than 1 week ago. Injury mechanism: was getting out a high truck. The pain is present in the right hip. The pain is at a severity of 9/10. The pain is moderate. The pain has been Intermittent since onset. She has tried NSAIDs for the symptoms. The treatment provided mild relief.  ?Constipation ?This is a new problem. The current episode started 1 to 4 weeks ago. The problem has been waxing and waning since onset.  ? ? ? ?Review of Systems  ?HENT:  Positive for ear pain  and sore throat (on left side). Negative for ear discharge and rhinorrhea.   ?Respiratory:  Negative for cough.   ?Gastrointestinal:  Positive for constipation.  ?Genitourinary:  Positive for vaginal discharge.  ?Neurological:  Positive for headaches.  ?All other systems reviewed and are negative. ? ?   ?Objective:  ? Physical Exam ?Vitals reviewed.  ?Constitutional:   ?   General: She is not in acute distress. ?   Appearance: She is well-developed. She is obese.  ?HENT:  ?   Head: Normocephalic and atraumatic.  ?   Right Ear: Tympanic membrane normal.  ?   Left Ear: Tenderness present. A middle ear effusion is present.  ?Eyes:  ?   Pupils: Pupils are equal, round, and reactive to light.  ?Neck:  ?   Thyroid: No thyromegaly.  ?Cardiovascular:  ?   Rate and Rhythm: Normal rate and regular rhythm.  ?   Heart sounds: Normal heart sounds. No murmur heard. ?Pulmonary:  ?   Effort: Pulmonary effort is normal. No respiratory distress.  ?   Breath sounds: Normal breath sounds. No wheezing.  ?Abdominal:  ?   General: Bowel sounds are normal. There is no distension.  ?   Palpations: Abdomen is soft.  ?   Tenderness: There is no abdominal tenderness.  ?Musculoskeletal:     ?   General: Tenderness (right hip pain with rotation) present. Normal range of  motion.  ?   Cervical back: Normal range of motion and neck supple.  ?Skin: ?   General: Skin is warm and dry.  ?Neurological:  ?   Mental Status: She is alert and oriented to person, place, and time.  ?   Cranial Nerves: No cranial nerve deficit.  ?   Deep Tendon Reflexes: Reflexes are normal and symmetric.  ?Psychiatric:     ?   Behavior: Behavior normal.     ?   Thought Content: Thought content normal.     ?   Judgment: Judgment normal.  ? ? ? ? ?BP 118/72   Pulse 80   Temp (!) 97 ?F (36.1 ?C) (Temporal)   Ht 5\' 4"  (1.626 m)   Wt (!) 302 lb 3.2 oz (137.1 kg)   BMI 51.87 kg/m?  ? ?   ?Assessment & Plan:  ?MELICIA ESQUEDA comes in today with chief complaint of Ear Pain  (Left ear pain  went to urgent care last week and the gave her abx. ), Vaginitis, and Hip Pain (Right hip) ? ? ?Diagnosis and orders addressed: ? ?1. Eustachian tube disorder, left ?Continue zyrtec and flonase ?Start decongestant and prednisone  ?- diclofenac (VOLTAREN) 75 MG EC tablet; Take 1 tablet (75 mg total) by mouth 2 (two) times daily.  Dispense: 60 tablet; Refill: 2 ?- predniSONE (STERAPRED UNI-PAK 21 TAB) 10 MG (21) TBPK tablet; Use as directed  Dispense: 21 tablet; Refill: 0 ?- fluticasone (FLONASE) 50 MCG/ACT nasal spray; Place 2 sprays into both nostrils daily.  Dispense: 16 g; Refill: 6 ?- cetirizine (ZYRTEC) 10 MG tablet; Take 1 tablet (10 mg total) by mouth daily.  Dispense: 30 tablet; Refill: 11 ? ?2. Right hip tendinitis ?Start diclofenac BID with food  ?No other NSAID's ?Prednisone as needed  ?- diclofenac (VOLTAREN) 75 MG EC tablet; Take 1 tablet (75 mg total) by mouth 2 (two) times daily.  Dispense: 60 tablet; Refill: 2 ?- predniSONE (STERAPRED UNI-PAK 21 TAB) 10 MG (21) TBPK tablet; Use as directed  Dispense: 21 tablet; Refill: 0 ? ?3. Vagina, candidiasis ?- fluconazole (DIFLUCAN) 150 MG tablet; Take 1 tablet (150 mg total) by mouth every three (3) days as needed.  Dispense: 3 tablet; Refill: 0 ? ?4. Constipation, unspecified constipation type ?Force fluids ?- polyethylene glycol powder (GLYCOLAX/MIRALAX) 17 GM/SCOOP powder; Take 17 g by mouth daily.  Dispense: 3350 g; Refill: 1 ? ? ?Labs pending ?Health Maintenance reviewed ?Diet and exercise encouraged ? ?Follow up plan: ?As needed  ? ? ?Ardeth Perfect, FNP ? ? ? ?

## 2021-08-02 NOTE — Telephone Encounter (Signed)
Pt called to let Alyse Low know that she forgot to tell Alyse Low that she has been having rectal bleeding. Wants to know if there is any medicine or cream that can be sent in for that? ?

## 2021-08-02 NOTE — Patient Instructions (Signed)
Eustachian Tube Dysfunction °Eustachian tube dysfunction refers to a condition in which a blockage develops in the narrow passage that connects the middle ear to the back of the nose (eustachian tube). The eustachian tube regulates air pressure in the middle ear by letting air move between the ear and nose. It also helps to drain fluid from the middle ear space. °Eustachian tube dysfunction can affect one or both ears. When the eustachian tube does not function properly, air pressure, fluid, or both can build up in the middle ear. °What are the causes? °This condition occurs when the eustachian tube becomes blocked or cannot open normally. Common causes of this condition include: °Ear infections. °Colds and other infections that affect the nose, mouth, and throat (upper respiratory tract). °Allergies. °Irritation from cigarette smoke. °Irritation from stomach acid coming up into the esophagus (gastroesophageal reflux). The esophagus is the part of the body that moves food from the mouth to the stomach. °Sudden changes in air pressure, such as from descending in an airplane or scuba diving. °Abnormal growths in the nose or throat, such as: °Growths that line the nose (nasal polyps). °Abnormal growth of cells (tumors). °Enlarged tissue at the back of the throat (adenoids). °What increases the risk? °You are more likely to develop this condition if: °You smoke. °You are overweight. °You are a child who has: °Certain birth defects of the mouth, such as cleft palate. °Large tonsils or adenoids. °What are the signs or symptoms? °Common symptoms of this condition include: °A feeling of fullness in the ear. °Ear pain. °Clicking or popping noises in the ear. °Ringing in the ear (tinnitus). °Hearing loss. °Loss of balance. °Dizziness. °Symptoms may get worse when the air pressure around you changes, such as when you travel to an area of high elevation, fly on an airplane, or go scuba diving. °How is this diagnosed? °This  condition may be diagnosed based on: °Your symptoms. °A physical exam of your ears, nose, and throat. °Tests, such as those that measure: °The movement of your eardrum. °Your hearing (audiometry). °How is this treated? °Treatment depends on the cause and severity of your condition. °In mild cases, you may relieve your symptoms by moving air into your ears. This is called "popping the ears." °In more severe cases, or if you have symptoms of fluid in your ears, treatment may include: °Medicines to relieve congestion (decongestants). °Medicines that treat allergies (antihistamines). °Nasal sprays or ear drops that contain medicines that reduce swelling (steroids). °A procedure to drain the fluid in your eardrum. In this procedure, a small tube may be placed in the eardrum to: °Drain the fluid. °Restore the air in the middle ear space. °A procedure to insert a balloon device through the nose to inflate the opening of the eustachian tube (balloon dilation). °Follow these instructions at home: °Lifestyle °Do not do any of the following until your health care provider approves: °Travel to high altitudes. °Fly in airplanes. °Work in a pressurized cabin or room. °Scuba dive. °Do not use any products that contain nicotine or tobacco. These products include cigarettes, chewing tobacco, and vaping devices, such as e-cigarettes. If you need help quitting, ask your health care provider. °Keep your ears dry. Wear fitted earplugs during showering and bathing. Dry your ears completely after. °General instructions °Take over-the-counter and prescription medicines only as told by your health care provider. °Use techniques to help pop your ears as recommended by your health care provider. These may include: °Chewing gum. °Yawning. °Frequent, forceful swallowing. °Closing   your mouth, holding your nose closed, and gently blowing as if you are trying to blow air out of your nose. °Keep all follow-up visits. This is important. °Contact a  health care provider if: °Your symptoms do not go away after treatment. °Your symptoms come back after treatment. °You are unable to pop your ears. °You have: °A fever. °Pain in your ear. °Pain in your head or neck. °Fluid draining from your ear. °Your hearing suddenly changes. °You become very dizzy. °You lose your balance. °Get help right away if: °You have a sudden, severe increase in any of your symptoms. °Summary °Eustachian tube dysfunction refers to a condition in which a blockage develops in the eustachian tube. °It can be caused by ear infections, allergies, inhaled irritants, or abnormal growths in the nose or throat. °Symptoms may include ear pain or fullness, hearing loss, or ringing in the ears. °Mild cases are treated with techniques to unblock the ears, such as yawning or chewing gum. °More severe cases are treated with medicines or procedures. °This information is not intended to replace advice given to you by your health care provider. Make sure you discuss any questions you have with your health care provider. °Document Revised: 06/29/2020 Document Reviewed: 06/29/2020 °Elsevier Patient Education © 2022 [employer] ° °

## 2021-08-03 NOTE — Telephone Encounter (Signed)
No hemorrhoids. No hx of kids. Patient is sure it is coming from rectum. Patient states she has been constipated for 4-5 days. Patient she is able to use the bathroom now. Patient states it was a little sore she has had history of fissure before. She is having abdomen pain and nausea she did have an episode of throwing up one time this morning.  ?

## 2021-08-03 NOTE — Telephone Encounter (Signed)
She needs to be seen if this is not related to hemorrhoids.  ?

## 2021-08-03 NOTE — Telephone Encounter (Signed)
Is this bleeding from hemorrhoids?  ?

## 2021-08-03 NOTE — Telephone Encounter (Signed)
Patient aware and verbalized understanding. Patient can not come in today will make appt  ? ?

## 2021-08-05 ENCOUNTER — Ambulatory Visit: Payer: 59 | Admitting: Family

## 2021-10-06 ENCOUNTER — Ambulatory Visit: Payer: Self-pay

## 2021-10-07 ENCOUNTER — Ambulatory Visit (INDEPENDENT_AMBULATORY_CARE_PROVIDER_SITE_OTHER): Payer: BC Managed Care – PPO | Admitting: Family

## 2021-10-07 ENCOUNTER — Encounter: Payer: Self-pay | Admitting: Family

## 2021-10-07 VITALS — BP 139/83 | HR 83 | Temp 98.6°F | Ht 64.0 in | Wt 299.0 lb

## 2021-10-07 DIAGNOSIS — E1169 Type 2 diabetes mellitus with other specified complication: Secondary | ICD-10-CM

## 2021-10-07 DIAGNOSIS — K219 Gastro-esophageal reflux disease without esophagitis: Secondary | ICD-10-CM | POA: Diagnosis not present

## 2021-10-07 DIAGNOSIS — E559 Vitamin D deficiency, unspecified: Secondary | ICD-10-CM | POA: Diagnosis not present

## 2021-10-07 DIAGNOSIS — E282 Polycystic ovarian syndrome: Secondary | ICD-10-CM | POA: Diagnosis not present

## 2021-10-07 DIAGNOSIS — F411 Generalized anxiety disorder: Secondary | ICD-10-CM

## 2021-10-07 DIAGNOSIS — Z30013 Encounter for initial prescription of injectable contraceptive: Secondary | ICD-10-CM | POA: Diagnosis not present

## 2021-10-07 DIAGNOSIS — E785 Hyperlipidemia, unspecified: Secondary | ICD-10-CM

## 2021-10-07 LAB — BAYER DCA HB A1C WAIVED: HB A1C (BAYER DCA - WAIVED): 7.9 % — ABNORMAL HIGH (ref 4.8–5.6)

## 2021-10-07 MED ORDER — MEDROXYPROGESTERONE ACETATE 150 MG/ML IM SUSP
150.0000 mg | Freq: Once | INTRAMUSCULAR | Status: AC
  Administered 2021-10-07: 150 mg via INTRAMUSCULAR

## 2021-10-07 MED ORDER — FREESTYLE LIBRE SENSOR SYSTEM MISC: 1.0000 | Freq: Four times a day (QID) | 2 refills | Status: DC

## 2021-10-07 MED ORDER — PANTOPRAZOLE SODIUM 20 MG PO TBEC: 20.0000 mg | DELAYED_RELEASE_TABLET | Freq: Every day | ORAL | 1 refills | Status: DC

## 2021-10-07 NOTE — Patient Instructions (Signed)

## 2021-10-07 NOTE — Progress Notes (Signed)
Subjective:    Patient ID: Glenda Mitchell, female    DOB: 1975/06/27, 47 y.o.   MRN: [id number]  Chief Complaint  Patient presents with   Follow-up    3 month   Pt presents to the office today for chronic follow up. She is followed by GYN annually for PCOS. She is taking depo provera every 3 months.    She is currently taking Ozempic  2 mg and has lost 12 lbs since July.  Gastroesophageal Reflux She complains of belching and heartburn. This is a chronic problem. The current episode started more than 1 year ago. The problem occurs occasionally. The problem has been waxing and waning. Risk factors include obesity. She has tried a PPI for the symptoms. The treatment provided moderate relief.  Hyperlipidemia This is a chronic problem. The current episode started more than 1 year ago. The problem is controlled. Recent lipid tests were reviewed and are normal. Exacerbating diseases include obesity. Current antihyperlipidemic treatment includes statins. The current treatment provides moderate improvement of lipids. Risk factors for coronary artery disease include dyslipidemia, diabetes mellitus, hypertension, a sedentary lifestyle and post-menopausal.  Diabetes She presents for her follow-up diabetic visit. She has type 2 diabetes mellitus. Hypoglycemia symptoms include nervousness/anxiousness. Associated symptoms include foot paresthesias. Pertinent negatives for diabetes include no blurred vision. Symptoms are stable. Diabetic complications include heart disease and peripheral neuropathy. Risk factors for coronary artery disease include dyslipidemia, diabetes mellitus, hypertension, sedentary lifestyle and post-menopausal. She is following a generally unhealthy diet. (Does not check regularly)  Anxiety Presents for follow-up visit. Symptoms include depressed mood, excessive worry, irritability and nervous/anxious behavior. Symptoms occur most days. The severity of symptoms is moderate.         Review of Systems  Constitutional:  Positive for irritability.  Eyes:  Negative for blurred vision.  Gastrointestinal:  Positive for heartburn.  Psychiatric/Behavioral:  The patient is nervous/anxious.   All other systems reviewed and are negative.      Objective:   Physical Exam Vitals reviewed.  Constitutional:      General: She is not in acute distress.    Appearance: She is well-developed. She is obese.  HENT:     Head: Normocephalic and atraumatic.     Right Ear: Tympanic membrane normal.     Left Ear: Tympanic membrane normal.  Eyes:     Pupils: Pupils are equal, round, and reactive to light.  Neck:     Thyroid: No thyromegaly.  Cardiovascular:     Rate and Rhythm: Normal rate and regular rhythm.     Heart sounds: Normal heart sounds. No murmur heard. Pulmonary:     Effort: Pulmonary effort is normal. No respiratory distress.     Breath sounds: Normal breath sounds. No wheezing.  Abdominal:     General: Bowel sounds are normal. There is no distension.     Palpations: Abdomen is soft.     Tenderness: There is no abdominal tenderness.  Musculoskeletal:        General: No tenderness. Normal range of motion.     Cervical back: Normal range of motion and neck supple.  Skin:    General: Skin is warm and dry.  Neurological:     Mental Status: She is alert and oriented to person, place, and time.     Cranial Nerves: No cranial nerve deficit.     Deep Tendon Reflexes: Reflexes are normal and symmetric.  Psychiatric:        Behavior: Behavior normal.  Thought Content: Thought content normal.        Judgment: Judgment normal.     BP 139/83   Pulse 83   Temp 98.6 F (37 C)   Ht 5' 4"  (1.626 m)   Wt 299 lb (135.6 kg)   SpO2 95%   BMI 51.32 kg/m      Assessment & Plan:  Glenda Mitchell comes in today with chief complaint of Follow-up (3 month) and Toe Pain (Ingrown apt )   Diagnosis and orders addressed:  1. Type 2 diabetes mellitus with  other specified complication, without long-term current use of insulin (HCC) - Bayer DCA Hb A1c Waived - CMP14+EGFR - CBC with Differential/Platelet - Continuous Blood Gluc Sensor (FREESTYLE LIBRE SENSOR SYSTEM) MISC; 1 Device by Does not apply route in the morning, at noon, in the evening, and at bedtime.  Dispense: 1 each; Refill: 2  2. Hyperlipidemia associated with type 2 diabetes mellitus (HCC) - CMP14+EGFR - CBC with Differential/Platelet  3. PCOS (polycystic ovarian syndrome) - CMP14+EGFR - CBC with Differential/Platelet  4. Gastroesophageal reflux disease without esophagitis - CMP14+EGFR - CBC with Differential/Platelet - pantoprazole (PROTONIX) 20 MG tablet; Take 1 tablet (20 mg total) by mouth daily.  Dispense: 90 tablet; Refill: 1  5. Vitamin D deficiency - CMP14+EGFR - CBC with Differential/Platelet  6. GAD (generalized anxiety disorder) - CMP14+EGFR - CBC with Differential/Platelet   Labs pending Health Maintenance reviewed Diet and exercise encouraged  Follow up plan: 3 months    Evelina Dun, FNP

## 2021-10-08 ENCOUNTER — Other Ambulatory Visit: Payer: Self-pay | Admitting: Family

## 2021-10-08 DIAGNOSIS — E1169 Type 2 diabetes mellitus with other specified complication: Secondary | ICD-10-CM

## 2021-10-08 LAB — CBC WITH DIFFERENTIAL/PLATELET
Basophils Absolute: 0 10*3/uL (ref 0.0–0.2)
Basos: 0 %
EOS (ABSOLUTE): 0.2 10*3/uL (ref 0.0–0.4)
Eos: 2 %
Hematocrit: 38.9 % (ref 34.0–46.6)
Hemoglobin: 12.9 g/dL (ref 11.1–15.9)
Immature Grans (Abs): 0 10*3/uL (ref 0.0–0.1)
Immature Granulocytes: 0 %
Lymphocytes Absolute: 2.7 10*3/uL (ref 0.7–3.1)
Lymphs: 28 %
MCH: 28.7 pg (ref 26.6–33.0)
MCHC: 33.2 g/dL (ref 31.5–35.7)
MCV: 87 fL (ref 79–97)
Monocytes Absolute: 0.5 10*3/uL (ref 0.1–0.9)
Monocytes: 5 %
Neutrophils Absolute: 6.1 10*3/uL (ref 1.4–7.0)
Neutrophils: 65 %
Platelets: 303 10*3/uL (ref 150–450)
RBC: 4.49 x10E6/uL (ref 3.77–5.28)
RDW: 14.4 % (ref 11.7–15.4)
WBC: 9.5 10*3/uL (ref 3.4–10.8)

## 2021-10-08 LAB — CMP14+EGFR
ALT: 12 IU/L (ref 0–32)
AST: 13 IU/L (ref 0–40)
Albumin/Globulin Ratio: 1.8 (ref 1.2–2.2)
Albumin: 4.5 g/dL (ref 3.8–4.8)
Alkaline Phosphatase: 62 IU/L (ref 44–121)
BUN/Creatinine Ratio: 22 (ref 9–23)
BUN: 13 mg/dL (ref 6–24)
Bilirubin Total: 0.3 mg/dL (ref 0.0–1.2)
CO2: 21 mmol/L (ref 20–29)
Calcium: 9.4 mg/dL (ref 8.7–10.2)
Chloride: 100 mmol/L (ref 96–106)
Creatinine, Ser: 0.59 mg/dL (ref 0.57–1.00)
Globulin, Total: 2.5 g/dL (ref 1.5–4.5)
Glucose: 217 mg/dL — ABNORMAL HIGH (ref 70–99)
Potassium: 4.1 mmol/L (ref 3.5–5.2)
Sodium: 139 mmol/L (ref 134–144)
Total Protein: 7 g/dL (ref 6.0–8.5)
eGFR: 115 mL/min/{1.73_m2} (ref >59)

## 2021-10-31 ENCOUNTER — Other Ambulatory Visit: Payer: Self-pay | Admitting: Family

## 2021-10-31 DIAGNOSIS — M1A9XX0 Chronic gout, unspecified, without tophus (tophi): Secondary | ICD-10-CM

## 2021-12-22 ENCOUNTER — Other Ambulatory Visit: Payer: Self-pay | Admitting: Family

## 2021-12-22 DIAGNOSIS — E1169 Type 2 diabetes mellitus with other specified complication: Secondary | ICD-10-CM

## 2022-01-06 ENCOUNTER — Telehealth: Payer: Self-pay | Admitting: Family

## 2022-01-06 NOTE — Telephone Encounter (Signed)
PA is still pending for Ozempic

## 2022-01-07 NOTE — Telephone Encounter (Signed)
Pt needs sample of Ozempic to last her until she gets her medicine approved.   Call pt when sample is ready.

## 2022-01-07 NOTE — Telephone Encounter (Signed)
Pulled sample of Ozempic 2mg  for pt to pick up and nurse documented.

## 2022-01-11 NOTE — Telephone Encounter (Signed)
Your PA has been faxed to the plan as a paper copy. Please contact the plan directly if you haven't received a determination in a typical timeframe.  You will be notified of the determination electronically and via fax. 

## 2022-01-14 ENCOUNTER — Encounter: Payer: Self-pay | Admitting: Family

## 2022-01-14 ENCOUNTER — Ambulatory Visit: Payer: BC Managed Care – PPO | Admitting: Family

## 2022-01-14 VITALS — BP 108/69 | HR 85 | Temp 98.2°F | Ht 64.0 in | Wt 296.0 lb

## 2022-01-14 DIAGNOSIS — F411 Generalized anxiety disorder: Secondary | ICD-10-CM | POA: Diagnosis not present

## 2022-01-14 DIAGNOSIS — E282 Polycystic ovarian syndrome: Secondary | ICD-10-CM | POA: Diagnosis not present

## 2022-01-14 DIAGNOSIS — K219 Gastro-esophageal reflux disease without esophagitis: Secondary | ICD-10-CM

## 2022-01-14 DIAGNOSIS — E559 Vitamin D deficiency, unspecified: Secondary | ICD-10-CM

## 2022-01-14 DIAGNOSIS — E785 Hyperlipidemia, unspecified: Secondary | ICD-10-CM

## 2022-01-14 DIAGNOSIS — E1169 Type 2 diabetes mellitus with other specified complication: Secondary | ICD-10-CM

## 2022-01-14 LAB — CMP14+EGFR
ALT: 11 IU/L (ref 0–32)
AST: 9 IU/L (ref 0–40)
Albumin/Globulin Ratio: 1.8 (ref 1.2–2.2)
Albumin: 4.6 g/dL (ref 3.9–4.9)
Alkaline Phosphatase: 69 IU/L (ref 44–121)
BUN/Creatinine Ratio: 18 (ref 9–23)
BUN: 12 mg/dL (ref 6–24)
Bilirubin Total: 0.3 mg/dL (ref 0.0–1.2)
CO2: 21 mmol/L (ref 20–29)
Calcium: 10.2 mg/dL (ref 8.7–10.2)
Chloride: 101 mmol/L (ref 96–106)
Creatinine, Ser: 0.67 mg/dL (ref 0.57–1.00)
Globulin, Total: 2.5 g/dL (ref 1.5–4.5)
Glucose: 191 mg/dL — ABNORMAL HIGH (ref 70–99)
Potassium: 4.4 mmol/L (ref 3.5–5.2)
Sodium: 139 mmol/L (ref 134–144)
Total Protein: 7.1 g/dL (ref 6.0–8.5)
eGFR: 110 mL/min/{1.73_m2} (ref >59)

## 2022-01-14 LAB — BAYER DCA HB A1C WAIVED: HB A1C (BAYER DCA - WAIVED): 7.9 % — ABNORMAL HIGH (ref 4.8–5.6)

## 2022-01-14 MED ORDER — OZEMPIC (2 MG/DOSE) 8 MG/3ML ~~LOC~~ SOPN: PEN_INJECTOR | SUBCUTANEOUS | 3 refills | Status: DC

## 2022-01-14 NOTE — Progress Notes (Signed)
Subjective:    Patient ID: Glenda Mitchell, female    DOB: [birth year], 46 y.o.   MRN: [id number]  Chief Complaint  Patient presents with   Medical Management of Chronic Issues    Wants to come of depo provera    Pt presents to the office today for chronic follow up. She is followed by GYN annually for PCOS. She is taking depo provera every 3 months. She is wanting to stop this because she is having a lot of spotting every month.    She is currently taking Ozempic  2 mg and has lost 15 lbs since July.      01/14/2022    8:57 AM 10/07/2021    2:39 PM 08/02/2021    8:50 AM  Last 3 Weights  Weight (lbs) 296 lb 299 lb 302 lb 3.2 oz  Weight (kg) 134.265 kg 135.626 kg 137.077 kg     Gastroesophageal Reflux She complains of belching, heartburn and a hoarse voice. This is a chronic problem. The current episode started more than 1 year ago. The problem occurs occasionally. The problem has been waxing and waning. The symptoms are aggravated by certain foods. She has tried an antacid and a PPI for the symptoms. The treatment provided moderate relief.  Hyperlipidemia This is a chronic problem. The current episode started more than 1 year ago. The problem is controlled. Recent lipid tests were reviewed and are normal. Exacerbating diseases include obesity. Current antihyperlipidemic treatment includes statins. The current treatment provides moderate improvement of lipids. Risk factors for coronary artery disease include dyslipidemia, diabetes mellitus, hypertension, a sedentary lifestyle and post-menopausal.  Diabetes She presents for her follow-up diabetic visit. She has type 2 diabetes mellitus. Hypoglycemia symptoms include nervousness/anxiousness. Pertinent negatives for diabetes include no blurred vision and no foot paresthesias. Symptoms are stable. Diabetic complications include nephropathy. Pertinent negatives for diabetic complications include no CVA. Risk factors for coronary artery disease  include dyslipidemia, diabetes mellitus, hypertension and sedentary lifestyle. She is following a generally unhealthy diet. Her overall blood glucose range is 130-140 mg/dl. An ACE inhibitor/angiotensin II receptor blocker is being taken.  Anxiety Presents for follow-up visit. Symptoms include depressed mood, excessive worry, irritability and nervous/anxious behavior. Symptoms occur occasionally. The severity of symptoms is mild.        Review of Systems  Constitutional:  Positive for irritability.  HENT:  Positive for hoarse voice.   Eyes:  Negative for blurred vision.  Gastrointestinal:  Positive for heartburn.  Psychiatric/Behavioral:  The patient is nervous/anxious.   All other systems reviewed and are negative.      Objective:   Physical Exam Vitals reviewed.  Constitutional:      General: She is not in acute distress.    Appearance: She is well-developed. She is obese.  HENT:     Head: Normocephalic and atraumatic.     Right Ear: Tympanic membrane normal.     Left Ear: Tympanic membrane normal.  Eyes:     Pupils: Pupils are equal, round, and reactive to light.  Neck:     Thyroid: No thyromegaly.  Cardiovascular:     Rate and Rhythm: Normal rate and regular rhythm.     Heart sounds: Normal heart sounds. No murmur heard. Pulmonary:     Effort: Pulmonary effort is normal. No respiratory distress.     Breath sounds: Normal breath sounds. No wheezing.  Abdominal:     General: Bowel sounds are normal. There is no distension.     Palpations:  Abdomen is soft.     Tenderness: There is no abdominal tenderness.  Musculoskeletal:        General: No tenderness. Normal range of motion.     Cervical back: Normal range of motion and neck supple.  Skin:    General: Skin is warm and dry.  Neurological:     Mental Status: She is alert and oriented to person, place, and time.     Cranial Nerves: No cranial nerve deficit.     Deep Tendon Reflexes: Reflexes are normal and  symmetric.  Psychiatric:        Behavior: Behavior normal.        Thought Content: Thought content normal.        Judgment: Judgment normal.     BP 108/69   Pulse 85   Temp 98.2 F (36.8 C) (Temporal)   Ht 5' 4"  (1.626 m)   Wt 296 lb (134.3 kg)   BMI 50.81 kg/m        Assessment & Plan:  Glenda Mitchell comes in today with chief complaint of Medical Management of Chronic Issues (Wants to come of depo provera )   Diagnosis and orders addressed:  1. Gastroesophageal reflux disease without esophagitis - CMP14+EGFR  2. Type 2 diabetes mellitus with other specified complication, without long-term current use of insulin (HCC) - Bayer DCA Hb A1c Waived - CMP14+EGFR - Semaglutide, 2 MG/DOSE, (OZEMPIC, 2 MG/DOSE,) 8 MG/3ML SOPN; INJECT 2 MG AS DIRECTED ONCE A WEEK  Dispense: 9 mL; Refill: 3 - Microalbumin / creatinine urine ratio  3. PCOS (polycystic ovarian syndrome) - CMP14+EGFR  4. Hyperlipidemia associated with type 2 diabetes mellitus (HCC) - CMP14+EGFR  5. GAD (generalized anxiety disorder) - CMP14+EGFR  6. Vitamin D deficiency - CMP14+EGFR   Labs pending Health Maintenance reviewed Diet and exercise encouraged  Follow up plan: 6 months    Evelina Dun, FNP

## 2022-01-14 NOTE — Patient Instructions (Signed)

## 2022-01-16 LAB — MICROALBUMIN / CREATININE URINE RATIO
Creatinine, Urine: 91.5 mg/dL
Microalb/Creat Ratio: 12 mg/g creat (ref 0–29)
Microalbumin, Urine: 11.3 ug/mL

## 2022-01-25 ENCOUNTER — Telehealth: Payer: Self-pay | Admitting: Family Medicine

## 2022-01-25 NOTE — Telephone Encounter (Signed)
Lmtcb.

## 2022-01-25 NOTE — Telephone Encounter (Signed)
[facility] says they do not do prior authorization request because her her benefits for rx are not through them

## 2022-02-07 ENCOUNTER — Ambulatory Visit: Payer: BC Managed Care – PPO | Admitting: Nurse Practitioner

## 2022-02-07 ENCOUNTER — Ambulatory Visit (INDEPENDENT_AMBULATORY_CARE_PROVIDER_SITE_OTHER): Payer: BC Managed Care – PPO

## 2022-02-07 ENCOUNTER — Encounter: Payer: Self-pay | Admitting: Nurse Practitioner

## 2022-02-07 VITALS — BP 145/93 | HR 82 | Ht 64.0 in | Wt 304.0 lb

## 2022-02-07 DIAGNOSIS — M25572 Pain in left ankle and joints of left foot: Secondary | ICD-10-CM

## 2022-02-07 MED ORDER — KETOROLAC TROMETHAMINE 60 MG/2ML IM SOLN: 60.0000 mg | Freq: Once | INTRAMUSCULAR | Status: DC

## 2022-02-07 NOTE — Patient Instructions (Signed)
Ankle Pain The ankle joint holds your body weight and allows you to move around. Ankle pain can occur on either side or the back of one ankle or both ankles. Ankle pain may be sharp and burning or dull and aching. There may be tenderness, stiffness, redness, or warmth around the ankle. Many things can cause ankle pain, including an injury to the area and overuse of the ankle. Follow these instructions at home: Activity Rest your ankle as told by your health care provider. Avoid any activities that cause ankle pain. Do not use the injured limb to support your body weight until your health care provider says that you can. Use crutches as told by your health care provider. Do exercises as told by your health care provider. Ask your health care provider when it is safe to drive if you have a brace on your ankle. If you have a brace: Wear the brace as told by your health care provider. Remove it only as told by your health care provider. Loosen the brace if your toes tingle, become numb, or turn cold and blue. Keep the brace clean. If the brace is not waterproof: Do not let it get wet. Cover it with a watertight covering when you take a bath or shower. If you were given an elastic bandage:  Remove it when you take a bath or a shower. Try not to move your ankle very much, but wiggle your toes from time to time. This helps to prevent swelling. Adjust the bandage to make it more comfortable if it feels too tight. Loosen the bandage if you have numbness or tingling in your foot or if your foot turns cold and blue. Managing pain, stiffness, and swelling  If directed, put ice on the painful area. If you have a removable brace or elastic bandage, remove it as told by your health care provider. Put ice in a plastic bag. Place a towel between your skin and the bag. Leave the ice on for 20 minutes, 2-3 times a day. Move your toes often to avoid stiffness and to lessen swelling. Raise (elevate) your  ankle above the level of your heart while you are sitting or lying down. General instructions Record information about your pain. Writing down the following may be helpful for you and your health care provider: How often you have ankle pain. Where the pain is located. What the pain feels like. If treatment involves wearing a prescribed shoe or insole, make sure you wear it correctly and for as long as told by your health care provider. Take over-the-counter and prescription medicines only as told by your health care provider. Keep all follow-up visits as told by your health care provider. This is important. Contact a health care provider if: Your pain gets worse. Your pain is not relieved with medicines. You have a fever or chills. You are having more trouble with walking. You have new symptoms. Get help right away if: Your foot, leg, toes, or ankle: Tingles or becomes numb. Becomes swollen. Turns pale or blue. Summary Ankle pain can occur on either side or the back of one ankle or both ankles. Ankle pain may be sharp and burning or dull and aching. Rest your ankle as told by your health care provider. If told, apply ice to the area. Take over-the-counter and prescription medicines only as told by your health care provider. This information is not intended to replace advice given to you by your health care provider. Make sure you discuss   any questions you have with your health care provider. Document Revised: 06/11/2020 Document Reviewed: 06/11/2020 Elsevier Patient Education  2023 Elsevier Inc.  

## 2022-02-07 NOTE — Progress Notes (Signed)
Acute Office Visit  Subjective:     Patient ID: Glenda Mitchell, female    DOB: [birth year], 46 y.o.   MRN: [id number]  Chief Complaint  Patient presents with   Ankle Pain    Left ankle - wants xray - pt states she don't know exactly what happened - has been bothering her for little over a month but yesterday is when it started hurting so bad she couldn't walk      Ankle Pain  The incident occurred more than 1 week ago. The incident occurred at home. There was no injury mechanism. The pain is present in the left ankle. The quality of the pain is described as aching. The pain is at a severity of 8/10. The pain is severe. The pain has been Constant since onset. Associated symptoms include an inability to bear weight. Pertinent negatives include no loss of motion, loss of sensation, muscle weakness, numbness or tingling. She reports no foreign bodies present. The symptoms are aggravated by movement and palpation. She has tried ice and NSAIDs for the symptoms. The treatment provided mild relief.    Review of Systems  Constitutional: Negative.   HENT: Negative.    Cardiovascular: Negative.   Gastrointestinal: Negative.   Genitourinary: Negative.   Musculoskeletal: Negative.   Skin: Negative.   Neurological:  Negative for tingling and numbness.  All other systems reviewed and are negative.       Objective:    BP (!) 145/93 Comment: pt states she is taking meds at night  Pulse 82   Ht 5\' 4"  (1.626 m)   Wt (!) 304 lb (137.9 kg)   LMP 12/15/2021 (Approximate)   SpO2 93%   BMI 52.18 kg/m  BP Readings from Last 3 Encounters:  02/07/22 (!) 145/93  01/14/22 108/69  10/07/21 139/83   Wt Readings from Last 3 Encounters:  02/07/22 (!) 304 lb (137.9 kg)  01/14/22 296 lb (134.3 kg)  10/07/21 299 lb (135.6 kg)      Physical Exam Vitals and nursing note reviewed.  Constitutional:      Appearance: Normal appearance. She is obese.  HENT:     Head: Normocephalic.     Right Ear:  External ear normal.     Left Ear: External ear normal.     Nose: Nose normal.     Mouth/Throat:     Mouth: Mucous membranes are moist.     Pharynx: Oropharynx is clear.  Eyes:     Conjunctiva/sclera: Conjunctivae normal.  Cardiovascular:     Rate and Rhythm: Normal rate and regular rhythm.     Pulses: Normal pulses.     Heart sounds: Normal heart sounds.  Pulmonary:     Effort: Pulmonary effort is normal.     Breath sounds: Normal breath sounds.  Abdominal:     General: Bowel sounds are normal.  Musculoskeletal:     Left ankle: Tenderness present. No ATF ligament or AITF ligament tenderness. Decreased range of motion. Normal pulse.     Comments: Left ankle pain and tenderness  Skin:    General: Skin is warm.     Findings: No erythema or rash.  Neurological:     Mental Status: She is alert and oriented to person, place, and time.     No results found for any visits on 02/07/22.      Assessment & Plan:  Left ankle pain symptoms not well controlled in the past few weeks.  Patient has used ice and Voltaren with  no therapeutic effect.  Completed left ankle x-ray results pending.  Continue Voltaren 75 mg tablet by mouth twice daily as needed for pain.  Apply warm compress, elevate ankle and Ace wrap.  Patient refused Depo-Medrol shot in clinic today and prednisone due to effect on blood sugar.  Patient also refused Toradol shot for pain. I advised patient to follow up in clinic with unresolved symptoms. Problem List Items Addressed This Visit   None Visit Diagnoses     Acute left ankle pain    -  Primary   Relevant Medications   ketorolac (TORADOL) injection 60 mg   Other Relevant Orders   DG Ankle Complete Left       Meds ordered this encounter  Medications   ketorolac (TORADOL) injection 60 mg    Return if symptoms worsen or fail to improve, for left ankle pain.  Daryll Drown, NP

## 2022-02-08 NOTE — Telephone Encounter (Signed)
Called and spoke with insurance she called insurance and it was cover did not need PA. Pharmacy just had it on back order.

## 2022-02-09 ENCOUNTER — Other Ambulatory Visit: Payer: Self-pay | Admitting: Nurse Practitioner

## 2022-02-09 DIAGNOSIS — M25572 Pain in left ankle and joints of left foot: Secondary | ICD-10-CM

## 2022-02-11 ENCOUNTER — Ambulatory Visit (INDEPENDENT_AMBULATORY_CARE_PROVIDER_SITE_OTHER): Payer: BC Managed Care – PPO | Admitting: Physician Assistant

## 2022-02-11 ENCOUNTER — Telehealth: Payer: Self-pay | Admitting: Family

## 2022-02-11 ENCOUNTER — Encounter: Payer: Self-pay | Admitting: Physician Assistant

## 2022-02-11 DIAGNOSIS — M25572 Pain in left ankle and joints of left foot: Secondary | ICD-10-CM

## 2022-02-11 NOTE — Progress Notes (Signed)
Office Visit Note   Patient: Glenda Mitchell           Date of Birth: 1975-08-14           MRN: [id number] Visit Date: 02/11/2022              Requested by: Sharion Balloon, [place] [address],  [place] [address] PCP: Sharion Balloon, FNP  Chief Complaint  Patient presents with   Left Ankle - Pain      HPI: Glenda Mitchell is a pleasant 46 year old woman with a 1 week history of left ankle pain.  She denies any injuries but she does admit she is a busy mother of 3 and sometimes she may twist or turn and does not really think about it.  She is status post multiple surgeries on her right foot and ankle.  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  Final surgery per her description was a subtalar fusion.  She has bilateral flat feet.  She points to the pain laterally just below the lateral malleolus.  She was seen and evaluated and told to use an Ace bandage  Assessment & Plan: Visit Diagnoses:  1. Pain in left ankle and joints of left foot     Plan: Patient has quite a bit of pes planus.  She does have function of her posterior tibial tendon without much pain she is tender over the inferolateral malleoli or space.  Consistent with a little peroneal tendinitis.  Also could have some lateral impingement.  We will treat this conservatively initially with topical anti-inflammatory.  Her husband also ordered a short cam walker boot I think that would be a good idea I have also provided her with full-length inserts that I want her to use in the boot and try in her shoes.  She was she will wear as she was wearing today were very unsupported and did not have any kind of arch support.  Follow-Up Instructions: Return in about 3 weeks (around 03/04/2022).   Ortho Exam  Patient is alert, oriented, no adenopathy, well-dressed, normal affect, normal respiratory effort. Examination of her left ankle she has palpable dorsalis pedis pulse.  She has no cellulitis  no erythema no swelling.  She does have pes planus bilaterally.  She has good dorsiflexion plantarflexion inversion and eversion though in E version she is slightly tender along the peroneal tendon.  She is tender over the peroneal tendon just below the lateral malleolus but I cannot appreciate any subluxation.  Sensation is intact.  Tender over the sinus tarsi but no tenderness with subtalar range of motion  Imaging: No results found. No images are attached to the encounter.  Labs: Lab Results  Component Value Date   HGBA1C 7.9 (H) 01/14/2022   HGBA1C 7.9 (H) 10/07/2021   HGBA1C 7.5 (H) 07/07/2021     Lab Results  Component Value Date   ALBUMIN 4.6 01/14/2022   ALBUMIN 4.5 10/07/2021   ALBUMIN 4.1 07/07/2021    No results found for: "MG" Lab Results  Component Value Date   VD25OH 50.6 07/07/2020    No results found for: "PREALBUMIN"    Latest Ref Rng & Units 10/07/2021    3:02 PM 07/07/2021    9:14 AM 04/20/2021   11:00 AM  CBC EXTENDED  WBC 3.4 - 10.8 x10E3/uL 9.5  8.7  9.5   RBC 3.77 - 5.28 x10E6/uL 4.49  4.46  4.64   Hemoglobin 11.1 -  15.9 g/dL 12.9  12.4  13.0   HCT 34.0 - 46.6 % 38.9  37.8  40.0   Platelets 150 - 450 x10E3/uL 303  301  355   NEUT# 1.4 - 7.0 x10E3/uL 6.1  5.3  5.6   Lymph# 0.7 - 3.1 x10E3/uL 2.7  2.6  3.1      There is no height or weight on file to calculate BMI.  Orders:  No orders of the defined types were placed in this encounter.  No orders of the defined types were placed in this encounter.    Procedures: No procedures performed  Clinical Data: No additional findings.  ROS:  All other systems negative, except as noted in the HPI. Review of Systems  Objective: Vital Signs: LMP 12/15/2021 (Approximate)   Specialty Comments:  No specialty comments available.  PMFS History: Patient Active Problem List   Diagnosis Date Noted   Pain in left ankle and joints of left foot 02/11/2022   GAD (generalized anxiety disorder)  01/31/2020   Hyperlipidemia associated with type 2 diabetes mellitus ([place]) 01/31/2020   Gastroesophageal reflux disease without esophagitis 06/19/2019   Vitamin D deficiency 06/19/2019   PCOS (polycystic ovarian syndrome) 06/19/2019   Diabetes mellitus ([place])    Gout    Past Medical History:  Diagnosis Date   Diabetes mellitus without complication ([place])    Gout    Hypertension     Family History  Problem Relation Age of Onset   Diabetes Mother    Cancer Mother        breast/lung   Heart attack Father    Hypertension Father    Diabetes Sister    Heart disease Sister    Cancer Maternal Grandmother        lung   Kidney disease Sister     Past Surgical History:  Procedure Laterality Date   foot surgery, right     Social History   Occupational History   Not on file  Tobacco Use   Smoking status: Never   Smokeless tobacco: Never  Vaping Use   Vaping Use: Never used  Substance and Sexual Activity   Alcohol use: Never   Drug use: Never   Sexual activity: Yes    Birth control/protection: None

## 2022-02-14 NOTE — Telephone Encounter (Signed)
I am sorry, there  is nothing I can do. It is a national back order.

## 2022-02-14 NOTE — Telephone Encounter (Signed)
Patient can not get ozempic its on national back order.  Patient can not take the pill she was on because it caused uti and yeast infection.  What can she do?

## 2022-02-14 NOTE — Telephone Encounter (Signed)
Is there something she can change it to if so what is it. Dont send in let patient know first so she can decide

## 2022-02-17 NOTE — Telephone Encounter (Signed)
Patient is willing to try [facility] if you will send in.

## 2022-02-17 NOTE — Telephone Encounter (Signed)
We could try Mounjaro? This is also back ordered and have a hard time getting.

## 2022-02-18 MED ORDER — TIRZEPATIDE 7.5 MG/0.5ML ~~LOC~~ SOAJ: 7.5000 mg | SUBCUTANEOUS | 2 refills | Status: DC

## 2022-02-18 NOTE — Telephone Encounter (Signed)
Pt says Mounjaro requires PA

## 2022-02-18 NOTE — Telephone Encounter (Signed)
Mounjaro 7.5 mg Prescription sent to pharmacy

## 2022-02-18 NOTE — Addendum Note (Signed)
Addended by: Evelina Dun A on: 02/18/2022 08:09 AM   Modules accepted: Orders

## 2022-02-18 NOTE — Telephone Encounter (Signed)
Just tried to do PA on [facility] and it said "Additional Information Required Your PA has been resolved, no additional PA is required. For further inquiries please contact the number on the back of the member prescription card. (Message [phone])"

## 2022-02-18 NOTE — Telephone Encounter (Signed)
Lmtcb   PER COVER MY MEDS "Just tried to do PA on [facility] and it said "Additional Information Required Your PA has been resolved, no additional PA is required. For further inquiries please contact the number on the back of the member prescription card. (Message [phone])""

## 2022-02-23 NOTE — Telephone Encounter (Signed)
Pt rc about this message. Pt says that pharmacy told her that the PA was cancelled and cannot get her rx. Pt has been without medication for two weeks. Please call back

## 2022-02-24 ENCOUNTER — Other Ambulatory Visit: Payer: Self-pay | Admitting: Family

## 2022-02-24 DIAGNOSIS — E1169 Type 2 diabetes mellitus with other specified complication: Secondary | ICD-10-CM

## 2022-02-24 DIAGNOSIS — E119 Type 2 diabetes mellitus without complications: Secondary | ICD-10-CM

## 2022-02-24 NOTE — Telephone Encounter (Signed)
Called and left message with Pharmacy to call back When done the Pa through Cover my meds they said PA was not needed

## 2022-02-25 ENCOUNTER — Telehealth: Payer: Self-pay | Admitting: Family

## 2022-02-25 MED ORDER — SEMAGLUTIDE (1 MG/DOSE) 4 MG/3ML ~~LOC~~ SOPN: 1.0000 mg | PEN_INJECTOR | SUBCUTANEOUS | 1 refills | Status: DC

## 2022-02-25 NOTE — Telephone Encounter (Signed)
Patient return call. ?

## 2022-02-25 NOTE — Telephone Encounter (Signed)
NA left detailed message

## 2022-02-25 NOTE — Telephone Encounter (Signed)
Left detailed

## 2022-02-25 NOTE — Telephone Encounter (Signed)
Ozempic 1 mg Prescription sent to pharmacy

## 2022-02-25 NOTE — Addendum Note (Signed)
Addended by: Evelina Dun A on: 02/25/2022 02:48 PM   Modules accepted: Orders

## 2022-02-25 NOTE — Telephone Encounter (Signed)
Pt says is asking if she can take the lower dose of ozempic that [facility] does have in stock. It is the 4MG . She says that this is better than not having medication at all. Pt says that sugars are around  250 220 after meals. Please call back

## 2022-03-07 NOTE — Telephone Encounter (Signed)
Attempts to contact pt without a return call in over 3 days, will close encounter. °

## 2022-03-08 ENCOUNTER — Ambulatory Visit: Payer: BC Managed Care – PPO | Admitting: Physician Assistant

## 2022-03-14 ENCOUNTER — Other Ambulatory Visit: Payer: Self-pay | Admitting: Family

## 2022-03-14 DIAGNOSIS — M76891 Other specified enthesopathies of right lower limb, excluding foot: Secondary | ICD-10-CM

## 2022-03-14 DIAGNOSIS — H6992 Unspecified Eustachian tube disorder, left ear: Secondary | ICD-10-CM

## 2022-05-11 ENCOUNTER — Telehealth: Payer: Self-pay | Admitting: *Deleted

## 2022-05-11 NOTE — Telephone Encounter (Signed)
Glenda Mitchell  (Key: [id number]) Ozempic (2 MG/DOSE) 8MG [name] pen-injectors  Sent to plan

## 2022-05-11 NOTE — Telephone Encounter (Signed)
Your PA Request has been closed. The receiver is not the PA processor for the drug requested.

## 2022-06-16 ENCOUNTER — Other Ambulatory Visit: Payer: Self-pay | Admitting: Family

## 2022-06-16 DIAGNOSIS — K219 Gastro-esophageal reflux disease without esophagitis: Secondary | ICD-10-CM

## 2022-07-09 ENCOUNTER — Other Ambulatory Visit: Payer: Self-pay | Admitting: Family

## 2022-07-09 DIAGNOSIS — M1A9XX0 Chronic gout, unspecified, without tophus (tophi): Secondary | ICD-10-CM

## 2022-07-09 DIAGNOSIS — E119 Type 2 diabetes mellitus without complications: Secondary | ICD-10-CM

## 2022-07-15 ENCOUNTER — Encounter: Payer: Self-pay | Admitting: Family

## 2022-07-15 ENCOUNTER — Ambulatory Visit (INDEPENDENT_AMBULATORY_CARE_PROVIDER_SITE_OTHER): Payer: BC Managed Care – PPO | Admitting: Family

## 2022-07-15 VITALS — BP 105/73 | HR 82 | Temp 98.0°F | Ht 64.0 in | Wt 301.0 lb

## 2022-07-15 DIAGNOSIS — E559 Vitamin D deficiency, unspecified: Secondary | ICD-10-CM

## 2022-07-15 DIAGNOSIS — I10 Essential (primary) hypertension: Secondary | ICD-10-CM

## 2022-07-15 DIAGNOSIS — Z0001 Encounter for general adult medical examination with abnormal findings: Secondary | ICD-10-CM

## 2022-07-15 DIAGNOSIS — K219 Gastro-esophageal reflux disease without esophagitis: Secondary | ICD-10-CM | POA: Diagnosis not present

## 2022-07-15 DIAGNOSIS — E282 Polycystic ovarian syndrome: Secondary | ICD-10-CM

## 2022-07-15 DIAGNOSIS — M1A9XX0 Chronic gout, unspecified, without tophus (tophi): Secondary | ICD-10-CM

## 2022-07-15 DIAGNOSIS — E1169 Type 2 diabetes mellitus with other specified complication: Secondary | ICD-10-CM

## 2022-07-15 DIAGNOSIS — E785 Hyperlipidemia, unspecified: Secondary | ICD-10-CM

## 2022-07-15 DIAGNOSIS — F411 Generalized anxiety disorder: Secondary | ICD-10-CM

## 2022-07-15 DIAGNOSIS — F320 Major depressive disorder, single episode, mild: Secondary | ICD-10-CM

## 2022-07-15 DIAGNOSIS — M76891 Other specified enthesopathies of right lower limb, excluding foot: Secondary | ICD-10-CM

## 2022-07-15 DIAGNOSIS — H6992 Unspecified Eustachian tube disorder, left ear: Secondary | ICD-10-CM

## 2022-07-15 DIAGNOSIS — Z0000 Encounter for general adult medical examination without abnormal findings: Secondary | ICD-10-CM

## 2022-07-15 LAB — BAYER DCA HB A1C WAIVED: HB A1C (BAYER DCA - WAIVED): 8.2 % — ABNORMAL HIGH (ref 4.8–5.6)

## 2022-07-15 LAB — LIPID PANEL

## 2022-07-15 MED ORDER — DICLOFENAC SODIUM 75 MG PO TBEC: 75.0000 mg | DELAYED_RELEASE_TABLET | Freq: Two times a day (BID) | ORAL | 1 refills | Status: DC

## 2022-07-15 MED ORDER — LOSARTAN POTASSIUM 50 MG PO TABS: 50.0000 mg | ORAL_TABLET | Freq: Every day | ORAL | 3 refills | Status: DC

## 2022-07-15 MED ORDER — ATORVASTATIN CALCIUM 20 MG PO TABS: 20.0000 mg | ORAL_TABLET | Freq: Every day | ORAL | 3 refills | Status: DC

## 2022-07-15 MED ORDER — FLUTICASONE PROPIONATE 50 MCG/ACT NA SUSP: 2.0000 | Freq: Every day | NASAL | 6 refills | Status: DC

## 2022-07-15 MED ORDER — METFORMIN HCL 1000 MG PO TABS: 1000.0000 mg | ORAL_TABLET | Freq: Two times a day (BID) | ORAL | 0 refills | Status: DC

## 2022-07-15 MED ORDER — CETIRIZINE HCL 10 MG PO TABS: 10.0000 mg | ORAL_TABLET | Freq: Every day | ORAL | 11 refills | Status: DC

## 2022-07-15 MED ORDER — ESCITALOPRAM OXALATE 10 MG PO TABS: 10.0000 mg | ORAL_TABLET | Freq: Every day | ORAL | 3 refills | Status: DC

## 2022-07-15 MED ORDER — TIRZEPATIDE 10 MG/0.5ML ~~LOC~~ SOAJ: 10.0000 mg | SUBCUTANEOUS | 2 refills | Status: DC

## 2022-07-15 NOTE — Patient Instructions (Signed)

## 2022-07-15 NOTE — Progress Notes (Signed)
Subjective:    Patient ID: Glenda Mitchell, female    DOB: [birth year], 47 y.o.   MRN: [id number]  Chief Complaint  Patient presents with   Medical Management of Chronic Issues   Pt presents to the office today for CPE and chronic follow up. She is followed by GYN annually for PCOS.    She is currently taking Ozempic  2 mg and has lost 15 lbs since July.     07/15/2022    9:15 AM 02/07/2022    3:03 PM 01/14/2022    8:57 AM  Last 3 Weights  Weight (lbs) 301 lb 304 lb 296 lb  Weight (kg) 136.533 kg 137.893 kg 134.265 kg     Gastroesophageal Reflux She complains of belching, heartburn and a hoarse voice. This is a chronic problem. The current episode started more than 1 year ago. The problem occurs occasionally. The problem has been waxing and waning. Risk factors include obesity. She has tried a PPI for the symptoms. The treatment provided moderate relief.  Hyperlipidemia This is a chronic problem. The current episode started more than 1 year ago. The problem is controlled. Recent lipid tests were reviewed and are normal. Exacerbating diseases include obesity. Current antihyperlipidemic treatment includes statins. The current treatment provides moderate improvement of lipids. Risk factors for coronary artery disease include dyslipidemia, hypertension and a sedentary lifestyle.  Diabetes She presents for her follow-up diabetic visit. She has type 2 diabetes mellitus. Hypoglycemia symptoms include nervousness/anxiousness. Associated symptoms include foot paresthesias. Pertinent negatives for diabetes include no blurred vision. Symptoms are stable. Diabetic complications include peripheral neuropathy. Risk factors for coronary artery disease include dyslipidemia, diabetes mellitus, hypertension and sedentary lifestyle. She is following a generally unhealthy diet. Her overall blood glucose range is 140-180 mg/dl.  Anxiety Presents for follow-up visit. Symptoms include depressed mood, excessive  worry, irritability, nervous/anxious behavior and restlessness. Symptoms occur occasionally. The severity of symptoms is moderate.        Review of Systems  Constitutional:  Positive for irritability.  HENT:  Positive for hoarse voice.   Eyes:  Negative for blurred vision.  Gastrointestinal:  Positive for heartburn.  Psychiatric/Behavioral:  The patient is nervous/anxious.   All other systems reviewed and are negative.  Family History  Problem Relation Age of Onset   Diabetes Mother    Cancer Mother        breast/lung   Heart attack Father    Hypertension Father    Diabetes Sister    Heart disease Sister    Cancer Maternal Grandmother        lung   Kidney disease Sister    Social History   Socioeconomic History   Marital status: Married    Spouse name: Not on file   Number of children: 3   Years of education: Not on file   Highest education level: Not on file  Occupational History   Not on file  Tobacco Use   Smoking status: Never   Smokeless tobacco: Never  Vaping Use   Vaping Use: Never used  Substance and Sexual Activity   Alcohol use: Never   Drug use: Never   Sexual activity: Yes    Birth control/protection: None  Other Topics Concern   Not on file  Social History Narrative   Not on file   Social Determinants of Health   Financial Resource Strain: Not on file  Food Insecurity: Not on file  Transportation Needs: Not on file  Physical Activity: Not on file  Stress: Not on file  Social Connections: Not on file       Objective:   Physical Exam Vitals reviewed.  Constitutional:      General: She is not in acute distress.    Appearance: She is well-developed. She is obese.  HENT:     Head: Normocephalic and atraumatic.     Right Ear: Tympanic membrane normal.     Left Ear: Tympanic membrane normal.  Eyes:     Pupils: Pupils are equal, round, and reactive to light.  Neck:     Thyroid: No thyromegaly.  Cardiovascular:     Rate and Rhythm:  Normal rate and regular rhythm.     Heart sounds: Normal heart sounds. No murmur heard. Pulmonary:     Effort: Pulmonary effort is normal. No respiratory distress.     Breath sounds: Normal breath sounds. No wheezing.  Abdominal:     General: Bowel sounds are normal. There is no distension.     Palpations: Abdomen is soft.     Tenderness: There is no abdominal tenderness.  Musculoskeletal:        General: No tenderness. Normal range of motion.     Cervical back: Normal range of motion and neck supple.  Skin:    General: Skin is warm and dry.  Neurological:     Mental Status: She is alert and oriented to person, place, and time.     Cranial Nerves: No cranial nerve deficit.     Deep Tendon Reflexes: Reflexes are normal and symmetric.  Psychiatric:        Behavior: Behavior normal.        Thought Content: Thought content normal.        Judgment: Judgment normal.    Diabetic Foot Exam - Simple   Simple Foot Form Diabetic Foot exam was performed with the following findings: Yes 07/15/2022  9:30 AM  Visual Inspection No deformities, no ulcerations, no other skin breakdown bilaterally: Yes Sensation Testing Intact to touch and monofilament testing bilaterally: Yes Pulse Check Posterior Tibialis and Dorsalis pulse intact bilaterally: Yes Comments      BP 105/73   Pulse 82   Temp 98 F (36.7 C) (Temporal)   Ht 5\' 4"  (1.626 m)   Wt (!) 301 lb (136.5 kg)   SpO2 96%   BMI 51.67 kg/m        Assessment & Plan:  NIKEA WOLTER comes in today with chief complaint of Medical Management of Chronic Issues   Diagnosis and orders addressed:  1. Type 2 diabetes mellitus with other specified complication, without long-term current use of insulin (HCC) Will change Ozempic 2 mg to Mounjaro 10 mg Low carb diet  - CMP14+EGFR - CBC with Differential/Platelet - Bayer DCA Hb A1c Waived - tirzepatide (MOUNJARO) 10 MG/0.5ML Pen; Inject 10 mg into the skin once a week.  Dispense: 6  mL; Refill: 2   2. Gastroesophageal reflux disease without esophagitis - CMP14+EGFR - CBC with Differential/Platelet  3. Chronic gout without tophus, unspecified cause, unspecified site - CMP14+EGFR - CBC with Differential/Platelet  4. Hyperlipidemia associated with type 2 diabetes mellitus (HCC) - atorvastatin (LIPITOR) 20 MG tablet; Take 1 tablet (20 mg total) by mouth daily.  Dispense: 90 tablet; Refill: 3 - CMP14+EGFR - CBC with Differential/Platelet - Lipid panel  5. GAD (generalized anxiety disorder) - escitalopram (LEXAPRO) 10 MG tablet; Take 1 tablet (10 mg total) by mouth daily.  Dispense: 90 tablet; Refill: 3 - CMP14+EGFR - CBC with Differential/Platelet  6. PCOS (polycystic ovarian syndrome) - CMP14+EGFR - CBC with Differential/Platelet  7. Vitamin D deficiency - CMP14+EGFR - CBC with Differential/Platelet - VITAMIN D 25 Hydroxy (Vit-D Deficiency, Fractures)  8. Eustachian tube disorder, left - cetirizine (ZYRTEC) 10 MG tablet; Take 1 tablet (10 mg total) by mouth daily.  Dispense: 30 tablet; Refill: 11 - diclofenac (VOLTAREN) 75 MG EC tablet; Take 1 tablet (75 mg total) by mouth 2 (two) times daily.  Dispense: 180 tablet; Refill: 1 - fluticasone (FLONASE) 50 MCG/ACT nasal spray; Place 2 sprays into both nostrils daily.  Dispense: 16 g; Refill: 6 - CMP14+EGFR - CBC with Differential/Platelet  9. Right hip tendinitis - diclofenac (VOLTAREN) 75 MG EC tablet; Take 1 tablet (75 mg total) by mouth 2 (two) times daily.  Dispense: 180 tablet; Refill: 1 - CMP14+EGFR - CBC with Differential/Platelet  10. Depression, major, single episode, mild (HCC) - escitalopram (LEXAPRO) 10 MG tablet; Take 1 tablet (10 mg total) by mouth daily.  Dispense: 90 tablet; Refill: 3 - CMP14+EGFR - CBC with Differential/Platelet  11. Essential hypertension - losartan (COZAAR) 50 MG tablet; Take 1 tablet (50 mg total) by mouth daily.  Dispense: 90 tablet; Refill: 3 - CMP14+EGFR - CBC  with Differential/Platelet  12. Annual physical exam - Lipid panel - TSH - VITAMIN D 25 Hydroxy (Vit-D Deficiency, Fractures)    Labs pending Health Maintenance reviewed Diet and exercise encouraged  Follow up plan: 3 months    Evelina Dun, FNP

## 2022-07-16 LAB — CBC WITH DIFFERENTIAL/PLATELET
Basophils Absolute: 0.1 10*3/uL (ref 0.0–0.2)
Basos: 1 %
EOS (ABSOLUTE): 0.3 10*3/uL (ref 0.0–0.4)
Eos: 4 %
Hematocrit: 38.4 % (ref 34.0–46.6)
Hemoglobin: 12.3 g/dL (ref 11.1–15.9)
Immature Grans (Abs): 0 10*3/uL (ref 0.0–0.1)
Immature Granulocytes: 0 %
Lymphocytes Absolute: 2.6 10*3/uL (ref 0.7–3.1)
Lymphs: 32 %
MCH: 27.3 pg (ref 26.6–33.0)
MCHC: 32 g/dL (ref 31.5–35.7)
MCV: 85 fL (ref 79–97)
Monocytes Absolute: 0.5 10*3/uL (ref 0.1–0.9)
Monocytes: 6 %
Neutrophils Absolute: 4.7 10*3/uL (ref 1.4–7.0)
Neutrophils: 57 %
Platelets: 316 10*3/uL (ref 150–450)
RBC: 4.5 x10E6/uL (ref 3.77–5.28)
RDW: 15 % (ref 11.7–15.4)
WBC: 8.2 10*3/uL (ref 3.4–10.8)

## 2022-07-16 LAB — CMP14+EGFR
ALT: 16 IU/L (ref 0–32)
AST: 15 IU/L (ref 0–40)
Albumin/Globulin Ratio: 1.7 (ref 1.2–2.2)
Albumin: 4.3 g/dL (ref 3.9–4.9)
Alkaline Phosphatase: 75 IU/L (ref 44–121)
BUN/Creatinine Ratio: 21 (ref 9–23)
BUN: 15 mg/dL (ref 6–24)
Bilirubin Total: 0.3 mg/dL (ref 0.0–1.2)
CO2: 23 mmol/L (ref 20–29)
Calcium: 10 mg/dL (ref 8.7–10.2)
Chloride: 97 mmol/L (ref 96–106)
Creatinine, Ser: 0.73 mg/dL (ref 0.57–1.00)
Globulin, Total: 2.5 g/dL (ref 1.5–4.5)
Glucose: 194 mg/dL — ABNORMAL HIGH (ref 70–99)
Potassium: 4.7 mmol/L (ref 3.5–5.2)
Sodium: 138 mmol/L (ref 134–144)
Total Protein: 6.8 g/dL (ref 6.0–8.5)
eGFR: 104 mL/min/{1.73_m2} (ref >59)

## 2022-07-16 LAB — LIPID PANEL
Chol/HDL Ratio: 4.7 ratio — ABNORMAL HIGH (ref 0.0–4.4)
Cholesterol, Total: 154 mg/dL (ref 100–199)
HDL: 33 mg/dL — ABNORMAL LOW (ref >39)
LDL Chol Calc (NIH): 62 mg/dL (ref 0–99)
Triglycerides: 381 mg/dL — ABNORMAL HIGH (ref 0–149)
VLDL Cholesterol Cal: 59 mg/dL — ABNORMAL HIGH (ref 5–40)

## 2022-07-16 LAB — TSH: TSH: 2.96 u[IU]/mL (ref 0.450–4.500)

## 2022-07-16 LAB — VITAMIN D 25 HYDROXY (VIT D DEFICIENCY, FRACTURES): Vit D, 25-Hydroxy: 33.9 ng/mL (ref 30.0–100.0)

## 2022-07-19 ENCOUNTER — Other Ambulatory Visit: Payer: Self-pay | Admitting: Family Medicine

## 2022-07-19 ENCOUNTER — Encounter: Payer: Self-pay | Admitting: Family

## 2022-07-19 DIAGNOSIS — E1169 Type 2 diabetes mellitus with other specified complication: Secondary | ICD-10-CM

## 2022-08-03 ENCOUNTER — Telehealth: Payer: Self-pay | Admitting: Family

## 2022-08-03 NOTE — Telephone Encounter (Signed)
Patient aware and verbalizes understanding. 

## 2022-08-03 NOTE — Telephone Encounter (Signed)
Pt wants to know if she can take two tablets of her pantoprazole (PROTONIX) 20 MG tablet. She is having more acid reflux and heartburn. Please call back

## 2022-08-07 ENCOUNTER — Encounter: Payer: Self-pay | Admitting: Emergency Medicine

## 2022-08-07 ENCOUNTER — Other Ambulatory Visit: Payer: Self-pay

## 2022-08-07 ENCOUNTER — Ambulatory Visit
Admission: EM | Admit: 2022-08-07 | Discharge: 2022-08-07 | Disposition: A | Discharge status: Home / Self Care | Payer: BC Managed Care – PPO | Attending: Emergency Medicine | Admitting: Emergency Medicine

## 2022-08-07 DIAGNOSIS — J020 Streptococcal pharyngitis: Secondary | ICD-10-CM | POA: Diagnosis not present

## 2022-08-07 LAB — POCT RAPID STREP A (OFFICE): Rapid Strep A Screen: POSITIVE — AB

## 2022-08-07 MED ORDER — AMOXICILLIN 500 MG PO CAPS: 500.0000 mg | ORAL_CAPSULE | Freq: Two times a day (BID) | ORAL | 0 refills | Status: AC

## 2022-08-07 NOTE — ED Triage Notes (Signed)
Pt reports fatigue, sore throat since yesterday. Pt reports recent exposure to strep.

## 2022-08-07 NOTE — Discharge Instructions (Addendum)
Your rapid strep test today was positive  Take amoxicillin twice a day for the next 10 days, daily will see improvement in about 48 hours and steady progression from there  To be use of salt gargles throat lozenges, warm liquids, teaspoons of honey and over-the-counter clippers septic spray for comfort  May give Tylenol or Motrin every 6 hours as needed for additional comfort  You may follow-up at urgent care as needed    

## 2022-08-07 NOTE — ED Provider Notes (Signed)
[facility]    CSN: [id number] Arrival date & time: 08/07/22  1333      History   Chief Complaint Chief Complaint  Patient presents with   Sore Throat    HPI Glenda Mitchell is a 47 y.o. female.   Patient presents for evaluation of fever, nasal congestion, sore throat and right-sided neck pain beginning 1 day ago.  Associated fatigue.  Fever peaking at 100.  Known exposure to strep 1 and half weeks ago.  Tolerating food and liquids but painful to swallow.  Has attempted use of Tylenol.    Past Medical History:  Diagnosis Date   Diabetes mellitus without complication    Gout    Hypertension     Patient Active Problem List   Diagnosis Date Noted   Essential hypertension 07/15/2022   Depression, major, single episode, mild 07/15/2022   Pain in left ankle and joints of left foot 02/11/2022   GAD (generalized anxiety disorder) 01/31/2020   Hyperlipidemia associated with type 2 diabetes mellitus 01/31/2020   Gastroesophageal reflux disease without esophagitis 06/19/2019   Vitamin D deficiency 06/19/2019   PCOS (polycystic ovarian syndrome) 06/19/2019   Diabetes mellitus    Gout     Past Surgical History:  Procedure Laterality Date   foot surgery, right      OB History   No obstetric history on file.      Home Medications    Prior to Admission medications   Medication Sig Start Date End Date Taking? Authorizing Provider  Semaglutide, 2 MG/DOSE, (OZEMPIC, 2 MG/DOSE,) 8 MG/3ML SOPN Inject into the skin.   Yes [provider]  allopurinol (ZYLOPRIM) 100 MG tablet Take 2 tablets by mouth once daily 07/10/22   Jannifer RodneyHawks, Christy A, FNP  atorvastatin (LIPITOR) 20 MG tablet Take 1 tablet (20 mg total) by mouth daily. 07/15/22   Jannifer RodneyHawks, Christy A, FNP  Blood Glucose Monitoring Suppl (ONETOUCH VERIO) w/Device KIT 1 kit by Does not apply route 3 (three) times daily before meals. 07/04/19   Remus LofflerJones, Angel S, PA-C  cetirizine (ZYRTEC) 10 MG tablet Take 1 tablet  (10 mg total) by mouth daily. 07/15/22   Junie SpencerHawks, Christy A, FNP  Continuous Blood Gluc Sensor (FREESTYLE LIBRE SENSOR SYSTEM) MISC 1 Device by Does not apply route in the morning, at noon, in the evening, and at bedtime. 10/07/21   Junie SpencerHawks, Christy A, FNP  diclofenac (VOLTAREN) 75 MG EC tablet Take 1 tablet (75 mg total) by mouth 2 (two) times daily. 07/15/22   Jannifer RodneyHawks, Christy A, FNP  escitalopram (LEXAPRO) 10 MG tablet Take 1 tablet (10 mg total) by mouth daily. 07/15/22   Junie SpencerHawks, Christy A, FNP  fluticasone (FLONASE) 50 MCG/ACT nasal spray Place 2 sprays into both nostrils daily. 07/15/22   Junie SpencerHawks, Christy A, FNP  glucose blood (ONETOUCH VERIO) test strip Test BS daily Dx E11.9 08/11/20   Junie SpencerHawks, Christy A, FNP  Lancets [facility](ONETOUCH ULTRASOFT) lancets Use as instructed 07/09/19   Remus LofflerJones, Angel S, PA-C  losartan (COZAAR) 50 MG tablet Take 1 tablet (50 mg total) by mouth daily. 07/15/22   Junie SpencerHawks, Christy A, FNP  metFORMIN (GLUCOPHAGE) 1000 MG tablet Take 1 tablet (1,000 mg total) by mouth 2 (two) times daily. 07/15/22   Junie SpencerHawks, Christy A, FNP  pantoprazole (PROTONIX) 20 MG tablet Take 1 tablet by mouth once daily 06/16/22   Jannifer RodneyHawks, Christy A, FNP  polyethylene glycol powder (GLYCOLAX/MIRALAX) 17 GM/SCOOP powder Take 17 g by mouth daily. 08/02/21   Jannifer RodneyHawks, Christy  A, FNP  tirzepatide (MOUNJARO) 10 MG/0.5ML Pen Inject 10 mg into the skin once a week. Patient taking differently: Inject 10 mg into the skin once a week. 07/15/22   Junie Spencer, FNP    Family History Family History  Problem Relation Age of Onset   Diabetes Mother    Cancer Mother        breast/lung   Heart attack Father    Hypertension Father    Diabetes Sister    Heart disease Sister    Cancer Maternal Grandmother        lung   Kidney disease Sister     Social History Social History   Tobacco Use   Smoking status: Never   Smokeless tobacco: Never  Vaping Use   Vaping Use: Never used  Substance Use Topics   Alcohol use: Never   Drug use:  Never     Allergies   Clarithromycin, Ivp dye [iodinated contrast media], and Jardiance [empagliflozin]   Review of Systems Review of Systems  Constitutional:  Positive for fatigue and fever. Negative for activity change, appetite change, chills, diaphoresis and unexpected weight change.  HENT:  Positive for rhinorrhea and sore throat. Negative for congestion, dental problem, drooling, ear discharge, ear pain, facial swelling, hearing loss, mouth sores, nosebleeds, postnasal drip, sinus pressure, sinus pain, sneezing, tinnitus, trouble swallowing and voice change.   Respiratory: Negative.    Cardiovascular: Negative.   Gastrointestinal: Negative.      Physical Exam Triage Vital Signs ED Triage Vitals  Enc Vitals Group     BP 08/07/22 1349 114/88     Pulse Rate 08/07/22 1349 86     Resp 08/07/22 1349 20     Temp 08/07/22 1349 97.6 F (36.4 C)     Temp Source 08/07/22 1349 Oral     SpO2 08/07/22 1349 95 %     Weight --      Height --      Head Circumference --      Peak Flow --      Pain Score 08/07/22 1345 9     Pain Loc --      Pain Edu? --      Excl. in GC? --    No data found.  Updated Vital Signs BP 114/88 (BP Location: Right Arm)   Pulse 86   Temp 97.6 F (36.4 C) (Oral)   Resp 20   SpO2 95%   Visual Acuity Right Eye Distance:   Left Eye Distance:   Bilateral Distance:    Right Eye Near:   Left Eye Near:    Bilateral Near:     Physical Exam Constitutional:      Appearance: She is well-developed.  HENT:     Head: Normocephalic.     Right Ear: Tympanic membrane and ear canal normal.     Left Ear: Tympanic membrane normal.     Nose: Congestion present. No rhinorrhea.     Mouth/Throat:     Mouth: Mucous membranes are moist.     Pharynx: Posterior oropharyngeal erythema present.     Tonsils: No tonsillar exudate. 3+ on the right. 3+ on the left.  Pulmonary:     Effort: Pulmonary effort is normal.  Lymphadenopathy:     Cervical: Cervical  adenopathy present.  Neurological:     Mental Status: She is alert and oriented to person, place, and time.      UC Treatments / Results  Labs (all labs ordered are listed, but  only abnormal results are displayed) Labs Reviewed  POCT RAPID STREP A (OFFICE) - Abnormal; Notable for the following components:      Result Value   Rapid Strep A Screen Positive (*)    All other components within normal limits    EKG   Radiology No results found.  Procedures Procedures (including critical care time)  Medications Ordered in UC Medications - No data to display  Initial Impression / Assessment and Plan / UC Course  I have reviewed the triage vital signs and the nursing notes.  Pertinent labs & imaging results that were available during my care of the patient were reviewed by me and considered in my medical decision making (see chart for details).   Strep pharyngitis  Confirmed on rapid testing, discussed findings with patient, amoxicillin prescribed and discussed administration, recommended additional supportive measures with urgent care follow-up as needed Final Clinical Impressions(s) / UC Diagnoses   Final diagnoses:  Strep pharyngitis     Discharge Instructions      Your rapid strep test today was positive  Take amoxicillin twice a day for the next 10 days, daily will see improvement in about 48 hours and steady progression from there  To be use of salt gargles throat lozenges, warm liquids, teaspoons of honey and over-the-counter clippers septic spray for comfort  May give Tylenol or Motrin every 6 hours as needed for additional comfort  You may follow-up at urgent care as needed      ED Prescriptions   None    PDMP not reviewed this encounter.   Valinda Hoar, NP 08/07/22 1406

## 2022-08-16 ENCOUNTER — Ambulatory Visit (INDEPENDENT_AMBULATORY_CARE_PROVIDER_SITE_OTHER): Payer: BC Managed Care – PPO | Admitting: Family

## 2022-08-16 ENCOUNTER — Encounter: Payer: Self-pay | Admitting: Family

## 2022-08-16 VITALS — BP 117/84 | HR 92 | Temp 98.1°F | Ht 64.0 in | Wt 300.4 lb

## 2022-08-16 DIAGNOSIS — E785 Hyperlipidemia, unspecified: Secondary | ICD-10-CM | POA: Diagnosis not present

## 2022-08-16 DIAGNOSIS — E1169 Type 2 diabetes mellitus with other specified complication: Secondary | ICD-10-CM | POA: Diagnosis not present

## 2022-08-16 DIAGNOSIS — I10 Essential (primary) hypertension: Secondary | ICD-10-CM | POA: Diagnosis not present

## 2022-08-16 MED ORDER — TRESIBA FLEXTOUCH 200 UNIT/ML ~~LOC~~ SOPN
8.0000 [IU] | PEN_INJECTOR | Freq: Every evening | SUBCUTANEOUS | 2 refills | Status: DC
Start: 2022-08-16 — End: 2023-05-25

## 2022-08-16 NOTE — Progress Notes (Signed)
Subjective:    Patient ID: Glenda Mitchell, female    DOB: 01-Dec-1975, 47 y.o.   MRN: [id number]  Chief Complaint  Patient presents with   Medical Management of Chronic Issues    Follow up forms sign    PT presents the office today for chronic follow up. She is still on the Ozempic 2 mg, but using last box and then will start Mounjaro 10 mg.      08/16/2022   10:18 AM 07/15/2022    9:15 AM 02/07/2022    3:03 PM  Last 3 Weights  Weight (lbs) 300 lb 6.4 oz 301 lb 304 lb  Weight (kg) 136.261 kg 136.533 kg 137.893 kg     Diabetes She presents for her follow-up diabetic visit. She has type 2 diabetes mellitus. Associated symptoms include foot paresthesias. Pertinent negatives for diabetes include no blurred vision. Symptoms are stable. Diabetic complications include peripheral neuropathy. Risk factors for coronary artery disease include dyslipidemia, diabetes mellitus, hypertension and sedentary lifestyle. She is following a generally unhealthy diet. Her overall blood glucose range is >200 mg/dl.  Hypertension This is a chronic problem. The current episode started more than 1 year ago. The problem has been resolved since onset. The problem is controlled. Pertinent negatives include no blurred vision, malaise/fatigue, peripheral edema or shortness of breath. Risk factors for coronary artery disease include dyslipidemia, diabetes mellitus and sedentary lifestyle. The current treatment provides moderate improvement.  Hyperlipidemia This is a chronic problem. The current episode started more than 1 year ago. Exacerbating diseases include obesity. Pertinent negatives include no shortness of breath. The current treatment provides mild improvement of lipids. Risk factors for coronary artery disease include diabetes mellitus, hypertension, a sedentary lifestyle, dyslipidemia and obesity.      Review of Systems  Constitutional:  Negative for malaise/fatigue.  Eyes:  Negative for blurred vision.   Respiratory:  Negative for shortness of breath.   All other systems reviewed and are negative.      Objective:   Physical Exam Vitals reviewed.  Constitutional:      General: She is not in acute distress.    Appearance: She is well-developed. She is obese.  HENT:     Head: Normocephalic and atraumatic.     Right Ear: Tympanic membrane normal.     Left Ear: Tympanic membrane normal.  Eyes:     Pupils: Pupils are equal, round, and reactive to light.  Neck:     Thyroid: No thyromegaly.  Cardiovascular:     Rate and Rhythm: Normal rate and regular rhythm.     Heart sounds: Normal heart sounds. No murmur heard. Pulmonary:     Effort: Pulmonary effort is normal. No respiratory distress.     Breath sounds: Normal breath sounds. No wheezing.  Abdominal:     General: Bowel sounds are normal. There is no distension.     Palpations: Abdomen is soft.     Tenderness: There is no abdominal tenderness.  Musculoskeletal:        General: No tenderness. Normal range of motion.     Cervical back: Normal range of motion and neck supple.  Skin:    General: Skin is warm and dry.  Neurological:     Mental Status: She is alert and oriented to person, place, and time.     Cranial Nerves: No cranial nerve deficit.     Deep Tendon Reflexes: Reflexes are normal and symmetric.  Psychiatric:        Behavior: Behavior normal.  Thought Content: Thought content normal.        Judgment: Judgment normal.       BP 117/84   Pulse 92   Temp 98.1 F (36.7 C) (Temporal)   Ht  (1.626 m)   Wt (!) 300 lb 6.4 oz (136.3 kg)   SpO2 95%   BMI 51.56 kg/m      Assessment & Plan:  Glenda Mitchell comes in today with chief complaint of Medical Management of Chronic Issues (Follow up forms sign )   Diagnosis and orders addressed:  1. Type 2 diabetes mellitus with other specified complication, without long-term current use of insulin Start Tresiba 8 units  Low carb diet  Continue Monjaro   Follow up in 2 months  - insulin degludec (TRESIBA FLEXTOUCH) 200 UNIT/ML FlexTouch Pen; Inject 8 Units into the skin every evening.  Dispense: 3 mL; Refill: 2  2. Essential hypertension  3. Hyperlipidemia associated with type 2 diabetes mellitus    Health Maintenance reviewed Diet and exercise encouraged  Follow up plan: 2 months for DM   [name], FNP

## 2022-08-16 NOTE — Patient Instructions (Signed)

## 2022-08-23 ENCOUNTER — Telehealth (INDEPENDENT_AMBULATORY_CARE_PROVIDER_SITE_OTHER): Payer: BC Managed Care – PPO | Admitting: Family

## 2022-08-23 ENCOUNTER — Encounter: Payer: Self-pay | Admitting: Family

## 2022-08-23 DIAGNOSIS — J029 Acute pharyngitis, unspecified: Secondary | ICD-10-CM

## 2022-08-23 DIAGNOSIS — K219 Gastro-esophageal reflux disease without esophagitis: Secondary | ICD-10-CM

## 2022-08-23 MED ORDER — PANTOPRAZOLE SODIUM 20 MG PO TBEC: 20.0000 mg | DELAYED_RELEASE_TABLET | Freq: Two times a day (BID) | ORAL | 0 refills | Status: DC

## 2022-08-23 MED ORDER — CLINDAMYCIN HCL 300 MG PO CAPS
300.0000 mg | ORAL_CAPSULE | Freq: Three times a day (TID) | ORAL | 0 refills | Status: AC
Start: 2022-08-23 — End: 2022-09-02

## 2022-08-23 NOTE — Progress Notes (Signed)
Virtual Visit Consent   Glenda Mitchell, you are scheduled for a virtual visit with a [facility] provider today. Just as with appointments in the office, your consent must be obtained to participate. Your consent will be active for this visit and any virtual visit you may have with one of our providers in the next 365 days. If you have a MyChart account, a copy of this consent can be sent to you electronically.  As this is a virtual visit, video technology does not allow for your provider to perform a traditional examination. This may limit your provider's ability to fully assess your condition. If your provider identifies any concerns that need to be evaluated in person or the need to arrange testing (such as labs, EKG, etc.), we will make arrangements to do so. Although advances in technology are sophisticated, we cannot ensure that it will always work on either your end or our end. If the connection with a video visit is poor, the visit may have to be switched to a telephone visit. With either a video or telephone visit, we are not always able to ensure that we have a secure connection.  By engaging in this virtual visit, you consent to the provision of healthcare and authorize for your insurance to be billed (if applicable) for the services provided during this visit. Depending on your insurance coverage, you may receive a charge related to this service.  I need to obtain your verbal consent now. Are you willing to proceed with your visit today? Glenda Mitchell has provided verbal consent on 08/23/2022 for a virtual visit (video or telephone). Glenda Rodney, FNP  Date: 08/23/2022 12:40 PM  Virtual Visit via Video Note   I, Glenda Mitchell, connected with  Glenda Mitchell  ([id number], 09/01/1975) on 08/23/22 at  5:00 PM EDT by a video-enabled telemedicine application and verified that I am speaking with the correct person using two identifiers.  Location: Patient: Virtual Visit Location  Patient: Home Provider: Virtual Visit Location Provider: Office/Clinic   I discussed the limitations of evaluation and management by telemedicine and the availability of in person appointments. The patient expressed understanding and agreed to proceed.    History of Present Illness: Glenda Mitchell is a 47 y.o. who identifies as a female who was assigned female at birth, and is being seen today for sore throat. She was diagnosed with strep throat on 08/07/22. She was given amoxicillin 500 mg BID for 10 days. She has completed this. However, her throat started hurting her again on two days.   HPI: Sore Throat  This is a recurrent problem. The current episode started in the past 7 days. The problem has been gradually worsening. There has been no fever. The pain is at a severity of 6/10. The pain is mild. Associated symptoms include coughing, ear pain (slight), headaches, swollen glands (slight) and trouble swallowing. Pertinent negatives include no congestion, ear discharge or hoarse voice. She has had exposure to strep.    Problems:  Patient Active Problem List   Diagnosis Date Noted   Essential hypertension 07/15/2022   Depression, major, single episode, mild 07/15/2022   Pain in left ankle and joints of left foot 02/11/2022   GAD (generalized anxiety disorder) 01/31/2020   Hyperlipidemia associated with type 2 diabetes mellitus 01/31/2020   Gastroesophageal reflux disease without esophagitis 06/19/2019   Vitamin D deficiency 06/19/2019   PCOS (polycystic ovarian syndrome) 06/19/2019   Diabetes mellitus    Gout  Allergies:  Allergies  Allergen Reactions   Clarithromycin Other (See Comments)    Chest pain and SOB   Ivp Dye [Iodinated Contrast Media]     Lip swelling when receiving dye for procedure   Jardiance [Empagliflozin]     Yeast infection    Medications:  Current Outpatient Medications:    clindamycin (CLEOCIN) 300 MG capsule, Take 1 capsule (300 mg total) by mouth 3  (three) times daily for 10 days., Disp: 30 capsule, Rfl: 0   allopurinol (ZYLOPRIM) 100 MG tablet, Take 2 tablets by mouth once daily, Disp: 180 tablet, Rfl: 0   atorvastatin (LIPITOR) 20 MG tablet, Take 1 tablet (20 mg total) by mouth daily., Disp: 90 tablet, Rfl: 3   Blood Glucose Monitoring Suppl (ONETOUCH VERIO) w/Device KIT, 1 kit by Does not apply route 3 (three) times daily before meals., Disp: 1 kit, Rfl: 11   cetirizine (ZYRTEC) 10 MG tablet, Take 1 tablet (10 mg total) by mouth daily., Disp: 30 tablet, Rfl: 11   Continuous Blood Gluc Sensor (FREESTYLE LIBRE SENSOR SYSTEM) MISC, 1 Device by Does not apply route in the morning, at noon, in the evening, and at bedtime., Disp: 1 each, Rfl: 2   diclofenac (VOLTAREN) 75 MG EC tablet, Take 1 tablet (75 mg total) by mouth 2 (two) times daily., Disp: 180 tablet, Rfl: 1   escitalopram (LEXAPRO) 10 MG tablet, Take 1 tablet (10 mg total) by mouth daily., Disp: 90 tablet, Rfl: 3   fluticasone (FLONASE) 50 MCG/ACT nasal spray, Place 2 sprays into both nostrils daily., Disp: 16 g, Rfl: 6   glucose blood (ONETOUCH VERIO) test strip, Test BS daily Dx E11.9, Disp: 100 each, Rfl: 11   insulin degludec (TRESIBA FLEXTOUCH) 200 UNIT/ML FlexTouch Pen, Inject 8 Units into the skin every evening., Disp: 3 mL, Rfl: 2   Lancets (ONETOUCH ULTRASOFT) lancets, Use as instructed, Disp: 100 each, Rfl: 12   losartan (COZAAR) 50 MG tablet, Take 1 tablet (50 mg total) by mouth daily., Disp: 90 tablet, Rfl: 3   metFORMIN (GLUCOPHAGE) 1000 MG tablet, Take 1 tablet (1,000 mg total) by mouth 2 (two) times daily., Disp: 180 tablet, Rfl: 0   pantoprazole (PROTONIX) 20 MG tablet, Take 1 tablet by mouth once daily, Disp: 90 tablet, Rfl: 0   polyethylene glycol powder (GLYCOLAX/MIRALAX) 17 GM/SCOOP powder, Take 17 g by mouth daily., Disp: 3350 g, Rfl: 1   tirzepatide (MOUNJARO) 10 MG/0.5ML Pen, Inject 10 mg into the skin once a week. (Patient taking differently: Inject 10 mg into the  skin once a week.), Disp: 6 mL, Rfl: 2  Observations/Objective: Patient is well-developed, well-nourished in no acute distress.  Resting comfortably  at home.  Head is normocephalic, atraumatic.  No labored breathing.  Speech is clear and coherent with logical content.  Patient is alert and oriented at baseline.  Throat erythemas   Assessment and Plan: 1. Acute pharyngitis, unspecified etiology - clindamycin (CLEOCIN) 300 MG capsule; Take 1 capsule (300 mg total) by mouth 3 (three) times daily for 10 days.  Dispense: 30 capsule; Refill: 0  - Take meds as prescribed - Use a cool mist humidifier  -Use saline nose sprays frequently -Force fluids -For any cough or congestion  Use plain Mucinex- regular strength or max strength is fine -For fever or aces or pains- take tylenol or ibuprofen. -Throat lozenges if help -New toothbrush in 3 days -Follow up if symptoms worsen or do not improve   Follow Up Instructions: I discussed the  assessment and treatment plan with the patient. The patient was provided an opportunity to ask questions and all were answered. The patient agreed with the plan and demonstrated an understanding of the instructions.  A copy of instructions were sent to the patient via MyChart unless otherwise noted below.     The patient was advised to call back or seek an in-person evaluation if the symptoms worsen or if the condition fails to improve as anticipated.  Time:  I spent 7 minutes with the patient via telehealth technology discussing the above problems/concerns.    Glenda Rodney, FNP

## 2022-08-30 ENCOUNTER — Encounter: Payer: Self-pay | Admitting: Family

## 2022-10-21 ENCOUNTER — Encounter: Payer: Self-pay | Admitting: Family

## 2022-10-21 ENCOUNTER — Ambulatory Visit: Payer: BC Managed Care – PPO | Admitting: Family

## 2022-10-21 VITALS — BP 139/86 | HR 81 | Temp 97.7°F | Ht 64.0 in | Wt 300.8 lb

## 2022-10-21 DIAGNOSIS — F320 Major depressive disorder, single episode, mild: Secondary | ICD-10-CM | POA: Diagnosis not present

## 2022-10-21 DIAGNOSIS — E559 Vitamin D deficiency, unspecified: Secondary | ICD-10-CM

## 2022-10-21 DIAGNOSIS — F411 Generalized anxiety disorder: Secondary | ICD-10-CM

## 2022-10-21 DIAGNOSIS — E1169 Type 2 diabetes mellitus with other specified complication: Secondary | ICD-10-CM

## 2022-10-21 DIAGNOSIS — E785 Hyperlipidemia, unspecified: Secondary | ICD-10-CM

## 2022-10-21 DIAGNOSIS — K219 Gastro-esophageal reflux disease without esophagitis: Secondary | ICD-10-CM

## 2022-10-21 DIAGNOSIS — I10 Essential (primary) hypertension: Secondary | ICD-10-CM | POA: Diagnosis not present

## 2022-10-21 LAB — BAYER DCA HB A1C WAIVED: HB A1C (BAYER DCA - WAIVED): 8.3 % — ABNORMAL HIGH (ref 4.8–5.6)

## 2022-10-21 MED ORDER — TIRZEPATIDE 12.5 MG/0.5ML ~~LOC~~ SOAJ
12.5000 mg | SUBCUTANEOUS | 2 refills | Status: DC
Start: 2022-10-21 — End: 2023-01-26

## 2022-10-21 NOTE — Progress Notes (Signed)
Subjective:    Patient ID: Glenda Mitchell, female    DOB: June 13, 1975, 47 y.o.   MRN: [id number]  Chief Complaint  Patient presents with   Medical Management of Chronic Issues   PT presents the office today for chronic follow up. She is taking Mounjaro 10 mg.      10/21/2022   12:19 PM 08/16/2022   10:18 AM 07/15/2022    9:15 AM  Last 3 Weights  Weight (lbs) 300 lb 12.8 oz 300 lb 6.4 oz 301 lb  Weight (kg) 136.442 kg 136.261 kg 136.533 kg     Hypertension This is a chronic problem. The current episode started more than 1 year ago. The problem has been resolved since onset. Associated symptoms include anxiety and malaise/fatigue. Pertinent negatives include no blurred vision, peripheral edema or shortness of breath. Risk factors for coronary artery disease include diabetes mellitus, dyslipidemia and obesity. The current treatment provides moderate improvement. There is no history of heart failure.  Gastroesophageal Reflux She complains of belching, heartburn and a hoarse voice. This is a chronic problem. The current episode started more than 1 year ago. The problem occurs occasionally. Risk factors include obesity. She has tried a PPI for the symptoms. The treatment provided moderate relief.  Hyperlipidemia This is a chronic problem. The current episode started more than 1 year ago. The problem is controlled. Recent lipid tests were reviewed and are normal. Exacerbating diseases include obesity. Pertinent negatives include no shortness of breath. Current antihyperlipidemic treatment includes statins. The current treatment provides moderate improvement of lipids. Risk factors for coronary artery disease include dyslipidemia, diabetes mellitus, hypertension, a sedentary lifestyle and post-menopausal.  Diabetes She presents for her follow-up diabetic visit. She has type 2 diabetes mellitus. Hypoglycemia symptoms include nervousness/anxiousness. Associated symptoms include foot paresthesias.  Pertinent negatives for diabetes include no blurred vision. Symptoms are stable. Diabetic complications include peripheral neuropathy. Risk factors for coronary artery disease include diabetes mellitus, dyslipidemia, hypertension, post-menopausal and sedentary lifestyle. She is following a generally unhealthy diet.  Anxiety Presents for follow-up visit. Symptoms include depressed mood, excessive worry, nervous/anxious behavior and restlessness. Patient reports no shortness of breath or suicidal ideas. The severity of symptoms is moderate.    Depression        This is a chronic problem.  The current episode started more than 1 year ago.   The problem occurs intermittently.  Associated symptoms include restlessness.  Associated symptoms include no helplessness, no hopelessness, not sad and no suicidal ideas.  Past treatments include SSRIs - Selective serotonin reuptake inhibitors.  Past medical history includes anxiety.       Review of Systems  Constitutional:  Positive for malaise/fatigue.  HENT:  Positive for hoarse voice.   Eyes:  Negative for blurred vision.  Respiratory:  Negative for shortness of breath.   Gastrointestinal:  Positive for heartburn.  Psychiatric/Behavioral:  Positive for depression. Negative for suicidal ideas. The patient is nervous/anxious.   All other systems reviewed and are negative.      Objective:   Physical Exam Vitals reviewed.  Constitutional:      General: She is not in acute distress.    Appearance: She is well-developed. She is obese.  HENT:     Head: Normocephalic and atraumatic.     Right Ear: Tympanic membrane normal.     Left Ear: Tympanic membrane normal.  Eyes:     Pupils: Pupils are equal, round, and reactive to light.  Neck:     Thyroid: No  thyromegaly.  Cardiovascular:     Rate and Rhythm: Normal rate and regular rhythm.     Heart sounds: Normal heart sounds. No murmur heard. Pulmonary:     Effort: Pulmonary effort is normal. No  respiratory distress.     Breath sounds: Normal breath sounds. No wheezing.  Abdominal:     General: Bowel sounds are normal. There is no distension.     Palpations: Abdomen is soft.     Tenderness: There is no abdominal tenderness.  Musculoskeletal:        General: No tenderness. Normal range of motion.     Cervical back: Normal range of motion and neck supple.  Skin:    General: Skin is warm and dry.  Neurological:     Mental Status: She is alert and oriented to person, place, and time.     Cranial Nerves: No cranial nerve deficit.     Deep Tendon Reflexes: Reflexes are normal and symmetric.  Psychiatric:        Behavior: Behavior normal.        Thought Content: Thought content normal.        Judgment: Judgment normal.       BP 139/86   Pulse 81   Temp 97.7 F (36.5 C) (Temporal)   Ht 5\' 4"  (1.626 m)   Wt (!) 300 lb 12.8 oz (136.4 kg)   SpO2 96%   BMI 51.63 kg/m      Assessment & Plan:  TALENA NEIRA comes in today with chief complaint of Medical Management of Chronic Issues   Diagnosis and orders addressed:  1. Type 2 diabetes mellitus with other specified complication, without long-term current use of insulin (HCC) - CBC with Differential/Platelet - CMP14+EGFR - Bayer DCA Hb A1c Waived - tirzepatide (MOUNJARO) 12.5 MG/0.5ML Pen; Inject 12.5 mg into the skin once a week.  Dispense: 6 mL; Refill: 2  2. Essential hypertension - CBC with Differential/Platelet - CMP14+EGFR  3. Hyperlipidemia associated with type 2 diabetes mellitus (HCC)  4. Depression, major, single episode, mild (HCC)  5. GAD (generalized anxiety disorder)  6. Gastroesophageal reflux disease without esophagitis  7. Vitamin D deficiency    Labs pending Increase Mounjaro to 12.5 mg from 10 mg  Health Maintenance reviewed Diet and exercise encouraged  Follow up plan: 3 months   [name], FNP

## 2022-10-21 NOTE — Patient Instructions (Signed)

## 2022-10-22 LAB — CMP14+EGFR
ALT: 14 IU/L (ref 0–32)
AST: 10 IU/L (ref 0–40)
Albumin: 4.1 g/dL (ref 3.9–4.9)
Alkaline Phosphatase: 71 IU/L (ref 44–121)
BUN/Creatinine Ratio: 22 (ref 9–23)
BUN: 13 mg/dL (ref 6–24)
Bilirubin Total: 0.3 mg/dL (ref 0.0–1.2)
CO2: 23 mmol/L (ref 20–29)
Calcium: 9.7 mg/dL (ref 8.7–10.2)
Chloride: 101 mmol/L (ref 96–106)
Creatinine, Ser: 0.59 mg/dL (ref 0.57–1.00)
Globulin, Total: 2.8 g/dL (ref 1.5–4.5)
Glucose: 138 mg/dL — ABNORMAL HIGH (ref 70–99)
Potassium: 4.7 mmol/L (ref 3.5–5.2)
Sodium: 139 mmol/L (ref 134–144)
Total Protein: 6.9 g/dL (ref 6.0–8.5)
eGFR: 114 mL/min/{1.73_m2} (ref >59)

## 2022-10-22 LAB — CBC WITH DIFFERENTIAL/PLATELET
Basophils Absolute: 0.1 10*3/uL (ref 0.0–0.2)
Basos: 1 %
EOS (ABSOLUTE): 0.5 10*3/uL — ABNORMAL HIGH (ref 0.0–0.4)
Eos: 6 %
Hematocrit: 37.8 % (ref 34.0–46.6)
Hemoglobin: 11.8 g/dL (ref 11.1–15.9)
Immature Grans (Abs): 0 10*3/uL (ref 0.0–0.1)
Immature Granulocytes: 0 %
Lymphocytes Absolute: 3 10*3/uL (ref 0.7–3.1)
Lymphs: 34 %
MCH: 27.3 pg (ref 26.6–33.0)
MCHC: 31.2 g/dL — ABNORMAL LOW (ref 31.5–35.7)
MCV: 87 fL (ref 79–97)
Monocytes Absolute: 0.5 10*3/uL (ref 0.1–0.9)
Monocytes: 6 %
Neutrophils Absolute: 4.8 10*3/uL (ref 1.4–7.0)
Neutrophils: 53 %
Platelets: 305 10*3/uL (ref 150–450)
RBC: 4.33 x10E6/uL (ref 3.77–5.28)
RDW: 14.5 % (ref 11.7–15.4)
WBC: 8.8 10*3/uL (ref 3.4–10.8)

## 2022-10-22 LAB — LIPID PANEL
Chol/HDL Ratio: 5.1 ratio — ABNORMAL HIGH (ref 0.0–4.4)
Cholesterol, Total: 202 mg/dL — ABNORMAL HIGH (ref 100–199)
HDL: 40 mg/dL (ref >39)
LDL Chol Calc (NIH): 100 mg/dL — ABNORMAL HIGH (ref 0–99)
Triglycerides: 370 mg/dL — ABNORMAL HIGH (ref 0–149)
VLDL Cholesterol Cal: 62 mg/dL — ABNORMAL HIGH (ref 5–40)

## 2022-10-24 ENCOUNTER — Other Ambulatory Visit: Payer: Self-pay | Admitting: Family

## 2022-11-08 ENCOUNTER — Telehealth: Payer: Self-pay | Admitting: Family

## 2022-11-08 NOTE — Telephone Encounter (Signed)
Patient calling back about labs from 6/21, was unable to attach to results notes. Please call back

## 2022-11-16 NOTE — Telephone Encounter (Signed)
No return call 

## 2022-11-29 ENCOUNTER — Encounter: Payer: Self-pay | Admitting: Family

## 2022-12-01 ENCOUNTER — Other Ambulatory Visit: Payer: Self-pay | Admitting: Family

## 2022-12-01 DIAGNOSIS — K219 Gastro-esophageal reflux disease without esophagitis: Secondary | ICD-10-CM

## 2022-12-01 LAB — HM PAP SMEAR: HPV, high-risk: NEGATIVE

## 2022-12-13 ENCOUNTER — Encounter: Payer: Self-pay | Admitting: Internal Medicine

## 2022-12-24 ENCOUNTER — Other Ambulatory Visit: Payer: Self-pay | Admitting: Family

## 2022-12-24 DIAGNOSIS — E1169 Type 2 diabetes mellitus with other specified complication: Secondary | ICD-10-CM

## 2023-01-09 ENCOUNTER — Other Ambulatory Visit: Payer: Self-pay | Admitting: Family Medicine

## 2023-01-09 ENCOUNTER — Ambulatory Visit (INDEPENDENT_AMBULATORY_CARE_PROVIDER_SITE_OTHER): Payer: BC Managed Care – PPO | Admitting: Family Medicine

## 2023-01-09 ENCOUNTER — Ambulatory Visit (INDEPENDENT_AMBULATORY_CARE_PROVIDER_SITE_OTHER): Payer: BC Managed Care – PPO

## 2023-01-09 ENCOUNTER — Encounter: Payer: Self-pay | Admitting: Family Medicine

## 2023-01-09 VITALS — BP 129/75 | HR 82 | Temp 98.1°F | Ht 64.0 in | Wt 298.4 lb

## 2023-01-09 DIAGNOSIS — Z9889 Other specified postprocedural states: Secondary | ICD-10-CM

## 2023-01-09 DIAGNOSIS — M545 Low back pain, unspecified: Secondary | ICD-10-CM

## 2023-01-09 DIAGNOSIS — M25571 Pain in right ankle and joints of right foot: Secondary | ICD-10-CM

## 2023-01-09 DIAGNOSIS — W19XXXA Unspecified fall, initial encounter: Secondary | ICD-10-CM

## 2023-01-09 DIAGNOSIS — M5441 Lumbago with sciatica, right side: Secondary | ICD-10-CM

## 2023-01-09 MED ORDER — PREDNISONE 10 MG (21) PO TBPK
ORAL_TABLET | ORAL | 0 refills | Status: DC
Start: 2023-01-09 — End: 2023-01-24

## 2023-01-09 NOTE — Progress Notes (Signed)
Acute Office Visit  Subjective:     Patient ID: Glenda Mitchell, female    DOB: 04-Mar-1976, 47 y.o.   MRN: [id number]  Chief Complaint  Patient presents with   Back Pain    Back Pain This is a new problem. Episode onset: 1 week, She did fall at a water park 3 weeks ago - landed on her bottom on concrete. The problem occurs intermittently. The problem is unchanged. The quality of the pain is described as shooting and aching. The pain radiates to the left thigh and right thigh. The symptoms are aggravated by bending, twisting, standing and sitting. Associated symptoms include leg pain (intermittent electrial shock pains down both legs). Pertinent negatives include no abdominal pain, bladder incontinence, bowel incontinence, dysuria, fever, headaches, numbness, paresis, pelvic pain, perianal numbness, tingling, weakness or weight loss. She has tried analgesics, NSAIDs, ice, heat, bed rest and walking for the symptoms. The treatment provided mild relief.   Back pain is midline lower, radiates to both side and thighs intermittently.   Pain in right ankle started today. Hx of surgery on this foot previously due to lack of cartridge. Pain around lateral malleolus. No known injury. Pain is a constant ache. Pain worse with rotation, weight bearing.  Ankle pain > back pain  Review of Systems  Constitutional:  Negative for fever and weight loss.  Gastrointestinal:  Negative for abdominal pain and bowel incontinence.  Genitourinary:  Negative for bladder incontinence, dysuria and pelvic pain.  Musculoskeletal:  Positive for back pain.  Neurological:  Negative for tingling, weakness, numbness and headaches.        Objective:    BP 129/75   Pulse 82   Temp 98.1 F (36.7 C) (Temporal)   Ht 5\' 4"  (1.626 m)   Wt 298 lb 6 oz (135.3 kg)   SpO2 95%   BMI 51.22 kg/m    Physical Exam Vitals and nursing note reviewed.  Constitutional:      General: She is not in acute distress.     Appearance: She is not ill-appearing, toxic-appearing or diaphoretic.  Pulmonary:     Effort: Pulmonary effort is normal. No respiratory distress.  Musculoskeletal:     Lumbar back: Tenderness present. No swelling, edema, signs of trauma, spasms or bony tenderness. Normal range of motion. Negative right straight leg raise test and negative left straight leg raise test.     Right lower leg: No edema.     Left lower leg: No edema.     Right ankle: No swelling, ecchymosis or lacerations. Tenderness present over the lateral malleolus.     Right Achilles Tendon: No tenderness or defects.  Skin:    General: Skin is warm and dry.  Neurological:     Mental Status: She is alert and oriented to person, place, and time.     Sensory: No sensory deficit.     Motor: No weakness.     Gait: Gait abnormal (antalgic).  Psychiatric:        Mood and Affect: Mood normal.        Behavior: Behavior normal.     No results found for any visits on 01/09/23.      Assessment & Plan:   Danylah was seen today for back pain.  Diagnoses and all orders for this visit:  Lumbar back pain -     Cancel: DG Lumbar Spine 2-3 Views; Future -     DG Lumbar Spine 2-3 Views; Future  Fall, initial encounter  Acute right ankle pain -     DG Ankle Complete Right; Future  History of foot surgery -     DG Ankle Complete Right; Future   Xrays pending today in office. Will notify patient of results and plan of care pending reports. Continue diclofenac for now. Return to office for new or worsening symptoms, or if symptoms persist.   The patient indicates understanding of these issues and agrees with the plan.  Gabriel Earing, FNP

## 2023-01-11 ENCOUNTER — Telehealth: Payer: Self-pay | Admitting: Family

## 2023-01-12 NOTE — Telephone Encounter (Signed)
Patient aware and verbalized understanding. °

## 2023-01-12 NOTE — Telephone Encounter (Signed)
Can increase [place] to 10-12 units. She can increase Lexapro to 20 mg.

## 2023-01-24 ENCOUNTER — Encounter: Payer: Self-pay | Admitting: Family

## 2023-01-24 ENCOUNTER — Ambulatory Visit (INDEPENDENT_AMBULATORY_CARE_PROVIDER_SITE_OTHER): Payer: BC Managed Care – PPO | Admitting: Family

## 2023-01-24 VITALS — BP 135/87 | HR 84 | Temp 97.8°F | Ht 64.0 in | Wt 300.4 lb

## 2023-01-24 DIAGNOSIS — M5136 Other intervertebral disc degeneration, lumbar region: Secondary | ICD-10-CM | POA: Diagnosis not present

## 2023-01-24 DIAGNOSIS — F320 Major depressive disorder, single episode, mild: Secondary | ICD-10-CM | POA: Diagnosis not present

## 2023-01-24 DIAGNOSIS — M4316 Spondylolisthesis, lumbar region: Secondary | ICD-10-CM

## 2023-01-24 DIAGNOSIS — I10 Essential (primary) hypertension: Secondary | ICD-10-CM

## 2023-01-24 DIAGNOSIS — F411 Generalized anxiety disorder: Secondary | ICD-10-CM

## 2023-01-24 DIAGNOSIS — E1169 Type 2 diabetes mellitus with other specified complication: Secondary | ICD-10-CM

## 2023-01-24 DIAGNOSIS — K219 Gastro-esophageal reflux disease without esophagitis: Secondary | ICD-10-CM

## 2023-01-24 DIAGNOSIS — E785 Hyperlipidemia, unspecified: Secondary | ICD-10-CM

## 2023-01-24 DIAGNOSIS — Z7985 Long-term (current) use of injectable non-insulin antidiabetic drugs: Secondary | ICD-10-CM

## 2023-01-24 LAB — CMP14+EGFR
ALT: 14 IU/L (ref 0–32)
AST: 15 IU/L (ref 0–40)
Albumin: 3.9 g/dL (ref 3.9–4.9)
Alkaline Phosphatase: 60 IU/L (ref 44–121)
BUN/Creatinine Ratio: 26 — ABNORMAL HIGH (ref 9–23)
BUN: 15 mg/dL (ref 6–24)
Bilirubin Total: 0.3 mg/dL (ref 0.0–1.2)
CO2: 21 mmol/L (ref 20–29)
Calcium: 9.4 mg/dL (ref 8.7–10.2)
Chloride: 100 mmol/L (ref 96–106)
Creatinine, Ser: 0.58 mg/dL (ref 0.57–1.00)
Globulin, Total: 2.4 g/dL (ref 1.5–4.5)
Glucose: 188 mg/dL — ABNORMAL HIGH (ref 70–99)
Potassium: 4.7 mmol/L (ref 3.5–5.2)
Sodium: 136 mmol/L (ref 134–144)
Total Protein: 6.3 g/dL (ref 6.0–8.5)
eGFR: 114 mL/min/{1.73_m2} (ref >59)

## 2023-01-24 LAB — BAYER DCA HB A1C WAIVED: HB A1C (BAYER DCA - WAIVED): 8.5 % — ABNORMAL HIGH (ref 4.8–5.6)

## 2023-01-24 MED ORDER — CELECOXIB 200 MG PO CAPS
200.0000 mg | ORAL_CAPSULE | Freq: Two times a day (BID) | ORAL | 1 refills | Status: DC
Start: 2023-01-24 — End: 2023-07-20

## 2023-01-24 NOTE — Progress Notes (Signed)
Subjective:    Patient ID: Glenda Mitchell, female    DOB: May 04, 1975, 47 y.o.   MRN: [id number]  Chief Complaint  Patient presents with   Medical Management of Chronic Issues    BP was low started taken half a losartan    Hip Pain    Right seen someone 2 weeks ago    PT presents the office today for chronic follow up. She is taking Mounjaro 10 mg.      01/24/2023    8:43 AM 01/09/2023    1:05 PM 10/21/2022   12:19 PM  Last 3 Weights  Weight (lbs) 300 lb 6.4 oz 298 lb 6 oz 300 lb 12.8 oz  Weight (kg) 136.261 kg 135.342 kg 136.442 kg      Hypertension This is a chronic problem. The current episode started more than 1 year ago. The problem has been resolved since onset. The problem is controlled. Associated symptoms include anxiety. Pertinent negatives include no blurred vision, malaise/fatigue, peripheral edema or shortness of breath. Risk factors for coronary artery disease include dyslipidemia, diabetes mellitus, obesity and sedentary lifestyle. The current treatment provides moderate improvement.  Gastroesophageal Reflux She complains of belching and heartburn. This is a chronic problem. The current episode started more than 1 year ago. The problem occurs occasionally. Risk factors include obesity. She has tried a PPI for the symptoms. The treatment provided moderate relief.  Hyperlipidemia This is a chronic problem. The current episode started more than 1 year ago. The problem is controlled. Recent lipid tests were reviewed and are normal. Exacerbating diseases include obesity. Pertinent negatives include no shortness of breath. Current antihyperlipidemic treatment includes statins. The current treatment provides moderate improvement of lipids. Risk factors for coronary artery disease include diabetes mellitus, hypertension, post-menopausal, a sedentary lifestyle and dyslipidemia.  Diabetes She presents for her follow-up diabetic visit. She has type 2 diabetes mellitus. Hypoglycemia  symptoms include nervousness/anxiousness. Associated symptoms include foot paresthesias. Pertinent negatives for diabetes include no blurred vision. Symptoms are stable. Diabetic complications include peripheral neuropathy. Risk factors for coronary artery disease include dyslipidemia, diabetes mellitus, hypertension, sedentary lifestyle and post-menopausal. She is following a generally unhealthy diet. Her overall blood glucose range is 130-140 mg/dl. Eye exam is current.  Anxiety Presents for follow-up visit. Symptoms include excessive worry and nervous/anxious behavior. Patient reports no shortness of breath. Symptoms occur rarely. The severity of symptoms is mild.    Depression        This is a chronic problem.  The current episode started more than 1 year ago.   The problem occurs rarely.  Associated symptoms include no helplessness, no hopelessness and not sad.  Past treatments include SSRIs - Selective serotonin reuptake inhibitors.  Past medical history includes anxiety.   Back Pain This is a new problem. The current episode started 1 to 4 weeks ago. The problem occurs intermittently. The problem has been waxing and waning since onset. The pain is present in the gluteal. The quality of the pain is described as aching. The pain is at a severity of 10/10 (when standing and flares up). The pain is moderate. The symptoms are aggravated by bending and standing. Risk factors include obesity. She has tried NSAIDs for the symptoms. The treatment provided mild relief.      Review of Systems  Constitutional:  Negative for malaise/fatigue.  Eyes:  Negative for blurred vision.  Respiratory:  Negative for shortness of breath.   Gastrointestinal:  Positive for heartburn.  Musculoskeletal:  Positive for  back pain.  Psychiatric/Behavioral:  Positive for depression. The patient is nervous/anxious.   All other systems reviewed and are negative.      Objective:   Physical Exam Vitals reviewed.   Constitutional:      General: She is not in acute distress.    Appearance: She is well-developed. She is obese.  HENT:     Head: Normocephalic and atraumatic.     Right Ear: Tympanic membrane normal.     Left Ear: Tympanic membrane normal.  Eyes:     Pupils: Pupils are equal, round, and reactive to light.  Neck:     Thyroid: No thyromegaly.  Cardiovascular:     Rate and Rhythm: Normal rate and regular rhythm.     Heart sounds: Normal heart sounds. No murmur heard. Pulmonary:     Effort: Pulmonary effort is normal. No respiratory distress.     Breath sounds: Normal breath sounds. No wheezing.  Abdominal:     General: Bowel sounds are normal. There is no distension.     Palpations: Abdomen is soft.     Tenderness: There is no abdominal tenderness.  Musculoskeletal:        General: No tenderness.     Cervical back: Normal range of motion and neck supple.       Legs:     Comments: Pain in right lower lumbar with flexion and extension  Skin:    General: Skin is warm and dry.  Neurological:     Mental Status: She is alert and oriented to person, place, and time.     Cranial Nerves: No cranial nerve deficit.     Deep Tendon Reflexes: Reflexes are normal and symmetric.  Psychiatric:        Behavior: Behavior normal.        Thought Content: Thought content normal.        Judgment: Judgment normal.       BP 135/87   Pulse 84   Temp 97.8 F (36.6 C) (Temporal)   Ht 5\' 4"  (1.626 m)   Wt (!) 300 lb 6.4 oz (136.3 kg)   SpO2 95%   BMI 51.56 kg/m      Assessment & Plan:  Glenda Mitchell comes in today with chief complaint of Medical Management of Chronic Issues (BP was low started taken half a losartan ) and Hip Pain (Right seen someone 2 weeks ago )   Diagnosis and orders addressed:  1. Degenerative disc disease, lumbar - celecoxib (CELEBREX) 200 MG capsule; Take 1 capsule (200 mg total) by mouth 2 (two) times daily.  Dispense: 180 capsule; Refill: 1 - Ambulatory  referral to Physical Therapy - Ambulatory referral to Orthopedic Surgery - CMP14+EGFR  2. Anterolisthesis of lumbar spine - celecoxib (CELEBREX) 200 MG capsule; Take 1 capsule (200 mg total) by mouth 2 (two) times daily.  Dispense: 180 capsule; Refill: 1 - Ambulatory referral to Physical Therapy - Ambulatory referral to Orthopedic Surgery - CMP14+EGFR  3. Depression, major, single episode, mild (HCC)  - CMP14+EGFR  4. Type 2 diabetes mellitus with other specified complication, without long-term current use of insulin (HCC) - Bayer DCA Hb A1c Waived - CMP14+EGFR  5. Essential hypertension - CMP14+EGFR  6. GAD (generalized anxiety disorder) - CMP14+EGFR  7. Gastroesophageal reflux disease without esophagitis  - CMP14+EGFR  8. Hyperlipidemia associated with type 2 diabetes mellitus (HCC) - CMP14+EGFR   Labs pending Stop Diclofenac and start Celebrex  Referral to Ortho and PT Health Maintenance reviewed Diet and  exercise encouraged  Follow up plan: 3 months    [name], FNP

## 2023-01-24 NOTE — Patient Instructions (Signed)
Degenerative Disk Disease  Degenerative disk disease is a condition caused by changes that occur in the spinal disks as a person ages. Spinal disks are soft and compressible disks located between the bones of your spine (vertebrae). These disks act like shock absorbers. Degenerative disk disease can affect the whole spine. However, the neck and lower back are most often affected. Many changes can occur in the spinal disks with aging, such as: The spinal disks may dry and shrink. Small tears may occur in the tough, outer covering of the disk (annulus). The disk space may become smaller due to loss of water. Abnormal growths in the bone (spurs) may occur. This can put pressure on the nerve roots exiting the spinal canal, causing pain. The spinal canal may become narrowed. What are the causes? This condition may be caused by: Normal degeneration with age. Injuries. Certain activities and sports that cause damage. What increases the risk? The following factors may make you more likely to develop this condition: Being overweight. Having a family history of degenerative disk disease. Smoking and use of products that contain nicotine and tobacco. Sudden injury. Doing work that requires heavy lifting. What are the signs or symptoms? Symptoms of this condition include: Pain that varies in intensity. Some people have no pain, while others have severe pain. The location of the pain depends on the part of your backbone that is affected. You may have: Pain in your neck or arm if a disk in your neck area is affected. Pain in your back, buttocks, or legs if a disk in your lower back is affected. Pain that becomes worse while bending or reaching up, or with twisting movements. Pain that may start gradually and worsen as time passes. It may also start after a major or minor injury. Numbness or tingling in the arms or legs. How is this diagnosed? This condition may be diagnosed based on: Your symptoms  and medical history. A physical exam. Imaging tests, including: X-ray of the spine. CT scan. MRI. How is this treated? This condition may be treated with: Medicines. Injection of steroids into the back. Rehabilitation exercises. These activities aim to strengthen muscles in your back and abdomen to better support your spine. If treatments do not help to relieve your symptoms or you have severe pain, you may need surgery. Follow these instructions at home: Medicines Take over-the-counter and prescription medicines only as told by your health care provider. Ask your health care provider if the medicine prescribed to you: Requires you to avoid driving or using machinery. Can cause constipation. You may need to take these actions to prevent or treat constipation: Drink enough fluid to keep your urine pale yellow. Take over-the-counter or prescription medicines. Eat foods that are high in fiber, such as beans, whole grains, and fresh fruits and vegetables. Limit foods that are high in fat and processed sugars, such as fried or sweet foods. Activity Rest as told by your health care provider. Avoid sitting for a long time without moving. Get up to take short walks every 1-2 hours. This is important to improve blood flow and breathing. Ask for help if you feel weak or unsteady. Return to your normal activities as told by your health care provider. Ask your health care provider what activities are safe for you. Perform relaxation exercises as told by your health care provider. Maintain good posture. Do not lift anything that is heavier than 10 lb (4.5 kg), or the limit that you are told, until your health  care provider says that it is safe. Follow proper lifting and walking techniques as told by your health care provider. Managing pain, stiffness, and swelling     If directed, put ice on the painful area. Icing can help to relieve pain. To do this: Put ice in a plastic bag. Place a towel  between your skin and the bag. Leave the ice on for 20 minutes, 2-3 times a day. Remove the ice if your skin turns bright red. This is very important. If you cannot feel pain, heat, or cold, you have a greater risk of damage to the area. If directed, apply heat to the painful area as often as told by your health care provider. Heat can reduce the stiffness of your muscles. Use the heat source that your health care provider recommends, such as a moist heat pack or a heating pad. Place a towel between your skin and the heat source. Leave the heat on for 20-30 minutes. Remove the heat if your skin turns bright red. This is especially important if you are unable to feel pain, heat, or cold. You may have a greater risk of getting burned. General instructions Change your sitting, standing, and sleeping habits as told by your health care provider. Avoid sitting in the same position for long periods of time. Change positions frequently. Lose weight or maintain a healthy weight as told by your health care provider. Do not use any products that contain nicotine or tobacco, such as cigarettes, e-cigarettes, and chewing tobacco. If you need help quitting, ask your health care provider. Wear supportive footwear. Keep all follow-up visits. This is important. This may include visits for physical therapy. Contact a health care provider if you: Have pain that does not go away within 1-4 weeks. Lose your appetite. Lose weight without trying. Get help right away if you: Have severe pain. Notice weakness in your arms, hands, or legs. Begin to lose control of your bladder or bowel movements. Have fevers or night sweats. Summary Degenerative disk disease is a condition caused by changes that occur in the spinal disks as a person ages. This condition can affect the whole spine. However, the neck and lower back are most often affected. Take over-the-counter and prescription medicines only as told by your health  care provider. This information is not intended to replace advice given to you by your health care provider. Make sure you discuss any questions you have with your health care provider. Document Revised: 08/01/2019 Document Reviewed: 08/01/2019 Elsevier Patient Education  2024 [employer].

## 2023-01-26 ENCOUNTER — Other Ambulatory Visit: Payer: Self-pay | Admitting: Family

## 2023-01-26 MED ORDER — MOUNJARO 15 MG/0.5ML ~~LOC~~ SOAJ: 15.0000 mg | SUBCUTANEOUS | 1 refills | Status: DC

## 2023-01-26 NOTE — Addendum Note (Signed)
Addended by: [name] A on: 01/26/2023 02:05 PM   Modules accepted: Orders

## 2023-02-06 ENCOUNTER — Telehealth: Payer: Self-pay | Admitting: Family

## 2023-02-06 DIAGNOSIS — M4316 Spondylolisthesis, lumbar region: Secondary | ICD-10-CM

## 2023-02-06 DIAGNOSIS — M51369 Other intervertebral disc degeneration, lumbar region without mention of lumbar back pain or lower extremity pain: Secondary | ICD-10-CM

## 2023-02-07 MED ORDER — BACLOFEN 10 MG PO TABS: 10.0000 mg | ORAL_TABLET | Freq: Three times a day (TID) | ORAL | 1 refills | Status: DC

## 2023-02-07 NOTE — Telephone Encounter (Signed)
Patient aware and verbalized understanding. °

## 2023-02-07 NOTE — Telephone Encounter (Signed)
Referral to Ortho and Chiropractic.

## 2023-02-07 NOTE — Telephone Encounter (Signed)
Baclofen Prescription sent to pharmacy  ? ?

## 2023-02-07 NOTE — Telephone Encounter (Signed)
CAN YOU SEND A MUSCLE RELAXER FOR PATIENT OR PAIN MED LOWEST DOSE

## 2023-02-07 NOTE — Telephone Encounter (Signed)
Patient aware and verbalizes understanding. 

## 2023-02-07 NOTE — Addendum Note (Signed)
Addended by: [name] A on: 02/07/2023 04:11 PM   Modules accepted: Orders

## 2023-02-10 ENCOUNTER — Ambulatory Visit: Payer: BC Managed Care – PPO | Admitting: Orthopaedic Surgery

## 2023-02-24 ENCOUNTER — Other Ambulatory Visit: Payer: Self-pay | Admitting: Family

## 2023-02-24 DIAGNOSIS — K219 Gastro-esophageal reflux disease without esophagitis: Secondary | ICD-10-CM

## 2023-02-24 DIAGNOSIS — M1A9XX0 Chronic gout, unspecified, without tophus (tophi): Secondary | ICD-10-CM

## 2023-03-02 ENCOUNTER — Ambulatory Visit: Payer: BC Managed Care – PPO | Admitting: Internal Medicine

## 2023-03-27 ENCOUNTER — Other Ambulatory Visit: Payer: Self-pay | Admitting: Family

## 2023-03-27 DIAGNOSIS — H6992 Unspecified Eustachian tube disorder, left ear: Secondary | ICD-10-CM

## 2023-03-27 DIAGNOSIS — M76891 Other specified enthesopathies of right lower limb, excluding foot: Secondary | ICD-10-CM

## 2023-04-28 ENCOUNTER — Ambulatory Visit: Payer: BC Managed Care – PPO | Admitting: Family

## 2023-04-29 ENCOUNTER — Other Ambulatory Visit: Payer: Self-pay | Admitting: Family

## 2023-04-29 DIAGNOSIS — E1169 Type 2 diabetes mellitus with other specified complication: Secondary | ICD-10-CM

## 2023-05-22 ENCOUNTER — Telehealth: Payer: Self-pay | Admitting: Family Medicine

## 2023-05-22 ENCOUNTER — Ambulatory Visit: Payer: Self-pay | Admitting: Family

## 2023-05-22 ENCOUNTER — Telehealth: Payer: Self-pay

## 2023-05-22 ENCOUNTER — Other Ambulatory Visit (HOSPITAL_COMMUNITY): Payer: Self-pay

## 2023-05-22 NOTE — Telephone Encounter (Signed)
  Chief Complaint: Medication Question Symptoms: bgl between 200-300 daily Frequency: daily Pertinent Negatives: Patient denies NA Disposition: [] ED /[] Urgent Care (no appt availability in office) / [x] Appointment(In office/virtual)/ []  [facility]/ [] Home Care/ [] Refused Recommended Disposition /[] [facility]/ []  Follow-up with PCP Additional Notes: Patient wants to know if Mounjaro dose is correct.  Has apt scheduled for this Thursday.  Declined sooner apts d/t wanting to be with her PCP.  Care advice given, denies questions and in no acute distress. Instructed to go to er if becomes worse.   Reason for Disposition  [1] Caller has NON-URGENT medicine question about med that PCP prescribed AND [2] triager unable to answer question  Answer Assessment - Initial Assessment Questions 1. NAME of MEDICINE: "What medicine(s) are you calling about?"     Mounjaro 2. QUESTION: "What is your question?" (e.g., double dose of medicine, side effect)     Not sure if the dose is correct as bgl is between 200-300  Protocols used: Medication Question Call-A-AH

## 2023-05-22 NOTE — Telephone Encounter (Unsigned)
Copied from CRM [id number]. Topic: Clinical - Prescription Issue >> May 22, 2023  2:00 PM Elle L wrote: Reason for CRM: The patient needs a prior authorization for her tirzepatide [facility]) 15 MG/0.5ML Pen.   [facility] [address] [place] - [address],  [place] [address] Phone: [phone]  Fax: [phone].

## 2023-05-22 NOTE — Telephone Encounter (Signed)
Patient aware and verbalized understanding. 15 was sent patient aware

## 2023-05-22 NOTE — Telephone Encounter (Signed)
Copied from CRM [id number]. Topic: Clinical - Pink Word Triage >> May 22, 2023 10:39 AM Geroge Baseman wrote: Reason for Triage: Elevated blood sugar staying around 200-300. Not emergent according to patient just wanted to speak with a nurse. Thinks medicine dosage is incorrect. Greggory Keen)

## 2023-05-22 NOTE — Telephone Encounter (Signed)
Pharmacy Patient Advocate Encounter   Received notification from Pt Calls Messages that prior authorization for Mounjaro 15 mg/ml pen injectors is required/requested.   Insurance verification completed.   The patient is insured through [facility] .   Per test claim: PA required; PA submitted to above mentioned insurance via Prompt PA Key/confirmation #/[id number] Status is pending

## 2023-05-22 NOTE — Telephone Encounter (Signed)
Patient aware will call if cancellation

## 2023-05-22 NOTE — Telephone Encounter (Signed)
Copied from CRM [id number]. Topic: Clinical - Prescription Issue >> May 22, 2023 10:35 AM Geroge Baseman wrote: Reason for CRM: Patient states she is prescribed 15mg  mounjaro but last time she picked up her prescription it was 12mg . She wanted to be sure this was correct as she thought the doctor was bumping it up to 15mg . She said that the pharmacy has the 12mg  but from what I can see the 15mg  was sent over for her in September 2024 I dont see a 12mg  ordered. She is about to pick up her medicine and wanted to be sure the dosage is correct.

## 2023-05-23 ENCOUNTER — Other Ambulatory Visit (HOSPITAL_COMMUNITY): Payer: Self-pay

## 2023-05-23 NOTE — Telephone Encounter (Signed)
PLEASE BE ADVISED LETTER OF APPROVAL IN MEDIA OF CHART. MOUNJARO

## 2023-05-24 ENCOUNTER — Telehealth: Payer: Self-pay | Admitting: Family

## 2023-05-24 NOTE — Telephone Encounter (Signed)
Pt asked to change in person visit to video for tomorrow. She does not have a Comptroller.

## 2023-05-25 ENCOUNTER — Telehealth: Payer: BC Managed Care – PPO | Admitting: Family

## 2023-05-25 ENCOUNTER — Encounter: Payer: Self-pay | Admitting: Family

## 2023-05-25 DIAGNOSIS — F411 Generalized anxiety disorder: Secondary | ICD-10-CM | POA: Diagnosis not present

## 2023-05-25 DIAGNOSIS — F320 Major depressive disorder, single episode, mild: Secondary | ICD-10-CM

## 2023-05-25 DIAGNOSIS — K219 Gastro-esophageal reflux disease without esophagitis: Secondary | ICD-10-CM

## 2023-05-25 DIAGNOSIS — Z7985 Long-term (current) use of injectable non-insulin antidiabetic drugs: Secondary | ICD-10-CM

## 2023-05-25 DIAGNOSIS — E1169 Type 2 diabetes mellitus with other specified complication: Secondary | ICD-10-CM | POA: Diagnosis not present

## 2023-05-25 DIAGNOSIS — I10 Essential (primary) hypertension: Secondary | ICD-10-CM | POA: Diagnosis not present

## 2023-05-25 DIAGNOSIS — M51369 Other intervertebral disc degeneration, lumbar region without mention of lumbar back pain or lower extremity pain: Secondary | ICD-10-CM

## 2023-05-25 DIAGNOSIS — E785 Hyperlipidemia, unspecified: Secondary | ICD-10-CM

## 2023-05-25 MED ORDER — TRESIBA FLEXTOUCH 200 UNIT/ML ~~LOC~~ SOPN
15.0000 [IU] | PEN_INJECTOR | Freq: Every evening | SUBCUTANEOUS | 2 refills | Status: DC
Start: 2023-05-25 — End: 2023-05-30

## 2023-05-25 MED ORDER — MOUNJARO 15 MG/0.5ML ~~LOC~~ SOAJ
15.0000 mg | SUBCUTANEOUS | 1 refills | Status: DC
Start: 2023-05-25 — End: 2023-10-30

## 2023-05-25 NOTE — Progress Notes (Signed)
Virtual Visit Consent   Glenda Mitchell, you are scheduled for a virtual visit with a [facility] provider today. Just as with appointments in the office, your consent must be obtained to participate. Your consent will be active for this visit and any virtual visit you may have with one of our providers in the next 365 days. If you have a MyChart account, a copy of this consent can be sent to you electronically.  As this is a virtual visit, video technology does not allow for your provider to perform a traditional examination. This may limit your provider's ability to fully assess your condition. If your provider identifies any concerns that need to be evaluated in person or the need to arrange testing (such as labs, EKG, etc.), we will make arrangements to do so. Although advances in technology are sophisticated, we cannot ensure that it will always work on either your end or our end. If the connection with a video visit is poor, the visit may have to be switched to a telephone visit. With either a video or telephone visit, we are not always able to ensure that we have a secure connection.  By engaging in this virtual visit, you consent to the provision of healthcare and authorize for your insurance to be billed (if applicable) for the services provided during this visit. Depending on your insurance coverage, you may receive a charge related to this service.  I need to obtain your verbal consent now. Are you willing to proceed with your visit today? Glenda Mitchell has provided verbal consent on 05/25/2023 for a virtual visit (video or telephone). [name], FNP  Date: 05/25/2023 1:13 PM  Virtual Visit via Video Note   I, [name], connected with  Glenda Mitchell  ([id number], 46/46/1977) on 05/25/23 at 10:55 AM EST by a video-enabled telemedicine application and verified that I am speaking with the correct person using two identifiers.  Location: Patient: Virtual Visit Location  Patient: Home Provider: Virtual Visit Location Provider: Home Office   I discussed the limitations of evaluation and management by telemedicine and the availability of in person appointments. The patient expressed understanding and agreed to proceed.    History of Present Illness: Glenda Mitchell is a 48 y.o. who identifies as a female who was assigned female at birth, and is being seen today for chronic follow up. She is taking Mounjaro 12.5 mg.      01/24/2023    8:43 AM 01/09/2023    1:05 PM 10/21/2022   12:19 PM  Last 3 Weights  Weight (lbs) 300 lb 6.4 oz 298 lb 6 oz 300 lb 12.8 oz  Weight (kg) 136.261 kg 135.342 kg 136.442 kg      HPI: Hypertension This is a chronic problem. The current episode started more than 1 year ago. The problem has been resolved since onset. The problem is controlled. Associated symptoms include anxiety and malaise/fatigue. Pertinent negatives include no blurred vision, peripheral edema or shortness of breath. Risk factors for coronary artery disease include diabetes mellitus, obesity and dyslipidemia. The current treatment provides moderate improvement.  Gastroesophageal Reflux She complains of belching and heartburn. This is a chronic problem. The current episode started more than 1 year ago. The problem occurs occasionally. Risk factors include obesity. She has tried a PPI for the symptoms. The treatment provided moderate relief.  Hyperlipidemia This is a chronic problem. The current episode started more than 1 year ago. The problem is controlled. Recent lipid tests were  reviewed and are normal. Pertinent negatives include no shortness of breath. Current antihyperlipidemic treatment includes statins. The current treatment provides moderate improvement of lipids. Risk factors for coronary artery disease include diabetes mellitus, dyslipidemia, hypertension, a sedentary lifestyle and post-menopausal.  Diabetes She presents for her follow-up diabetic visit. She  has type 2 diabetes mellitus. Hypoglycemia symptoms include nervousness/anxiousness. Associated symptoms include foot paresthesias. Pertinent negatives for diabetes include no blurred vision. Symptoms are stable. Diabetic complications include peripheral neuropathy. Risk factors for coronary artery disease include dyslipidemia, diabetes mellitus, hypertension, sedentary lifestyle and post-menopausal. She is following a generally unhealthy diet. Her overall blood glucose range is >200 mg/dl.  Back Pain This is a chronic problem. The current episode started more than 1 year ago. The problem occurs intermittently. The problem is unchanged. The pain is present in the lumbar spine. The quality of the pain is described as aching. The pain is at a severity of 2/10. The pain is moderate. Risk factors include obesity. She has tried chiropractic manipulation and bed rest for the symptoms.  Anxiety Presents for follow-up visit. Symptoms include depressed mood, excessive worry, nervous/anxious behavior and restlessness. Patient reports no decreased concentration or shortness of breath. Symptoms occur occasionally. The severity of symptoms is mild.    Depression        This is a chronic problem.  The current episode started more than 1 year ago.   Associated symptoms include restlessness.  Associated symptoms include no decreased concentration, no helplessness, no hopelessness and not sad.  Past medical history includes anxiety.     Problems:  Patient Active Problem List   Diagnosis Date Noted   Anterolisthesis of lumbar spine 01/24/2023   Degenerative disc disease, lumbar 01/24/2023   Essential hypertension 07/15/2022   Depression, major, single episode, mild (HCC) 07/15/2022   Pain in left ankle and joints of left foot 02/11/2022   GAD (generalized anxiety disorder) 01/31/2020   Hyperlipidemia associated with type 2 diabetes mellitus (HCC) 01/31/2020   Gastroesophageal reflux disease without esophagitis  06/19/2019   Vitamin D deficiency 06/19/2019   PCOS (polycystic ovarian syndrome) 06/19/2019   Diabetes mellitus (HCC)    Gout     Allergies:  Allergies  Allergen Reactions   Clarithromycin Other (See Comments)    Chest pain and SOB   Ivp Dye [Iodinated Contrast Media]     Lip swelling when receiving dye for procedure   Jardiance [Empagliflozin]     Yeast infection    Medications:  Current Outpatient Medications:    allopurinol (ZYLOPRIM) 100 MG tablet, Take 2 tablets by mouth once daily, Disp: 180 tablet, Rfl: 0   atorvastatin (LIPITOR) 20 MG tablet, Take 1 tablet (20 mg total) by mouth daily., Disp: 90 tablet, Rfl: 3   baclofen (LIORESAL) 10 MG tablet, Take 1 tablet (10 mg total) by mouth 3 (three) times daily., Disp: 90 each, Rfl: 1   Blood Glucose Monitoring Suppl (ONETOUCH VERIO) w/Device KIT, 1 kit by Does not apply route 3 (three) times daily before meals., Disp: 1 kit, Rfl: 11   celecoxib (CELEBREX) 200 MG capsule, Take 1 capsule (200 mg total) by mouth 2 (two) times daily., Disp: 180 capsule, Rfl: 1   cetirizine (ZYRTEC) 10 MG tablet, Take 1 tablet (10 mg total) by mouth daily., Disp: 30 tablet, Rfl: 11   Cholecalciferol (VITAMIN D-3 PO), Take 1,000 Units by mouth 3 (three) times a week., Disp: , Rfl:    Continuous Blood Gluc Sensor (FREESTYLE LIBRE SENSOR SYSTEM) MISC, 1 Device  by Does not apply route in the morning, at noon, in the evening, and at bedtime., Disp: 1 each, Rfl: 2   escitalopram (LEXAPRO) 10 MG tablet, Take 1 tablet (10 mg total) by mouth daily., Disp: 90 tablet, Rfl: 3   fluticasone (FLONASE) 50 MCG/ACT nasal spray, Place 2 sprays into both nostrils daily., Disp: 16 g, Rfl: 6   glucose blood (ONETOUCH VERIO) test strip, Test BS daily Dx E11.9, Disp: 100 each, Rfl: 11   insulin degludec (TRESIBA FLEXTOUCH) 200 UNIT/ML FlexTouch Pen, Inject 16 Units into the skin every evening., Disp: 6 mL, Rfl: 2   Lancets (ONETOUCH ULTRASOFT) lancets, Use as instructed, Disp:  100 each, Rfl: 12   losartan (COZAAR) 50 MG tablet, Take 1 tablet (50 mg total) by mouth daily., Disp: 90 tablet, Rfl: 3   metFORMIN (GLUCOPHAGE) 1000 MG tablet, Take 1 tablet by mouth twice daily, Disp: 180 tablet, Rfl: 0   pantoprazole (PROTONIX) 20 MG tablet, Take 1 tablet by mouth twice daily, Disp: 180 tablet, Rfl: 0   polyethylene glycol powder (GLYCOLAX/MIRALAX) 17 GM/SCOOP powder, Take 17 g by mouth daily., Disp: 3350 g, Rfl: 1   tirzepatide (MOUNJARO) 15 MG/0.5ML Pen, Inject 15 mg into the skin once a week., Disp: 6 mL, Rfl: 1  Observations/Objective: Patient is well-developed, well-nourished in no acute distress.  Resting comfortably at home.  Head is normocephalic, atraumatic.  No labored breathing.  Speech is clear and coherent with logical content.  Patient is alert and oriented at baseline.   Assessment and Plan: 1. Type 2 diabetes mellitus with other specified complication, without long-term current use of insulin (HCC) (Primary) - insulin degludec (TRESIBA FLEXTOUCH) 200 UNIT/ML FlexTouch Pen; Inject 16 Units into the skin every evening.  Dispense: 6 mL; Refill: 2 - tirzepatide (MOUNJARO) 15 MG/0.5ML Pen; Inject 15 mg into the skin once a week.  Dispense: 6 mL; Refill: 1 - Bayer DCA Hb A1c Waived; Future - CMP14+EGFR; Future - Microalbumin / creatinine urine ratio; Future  2. Gastroesophageal reflux disease without esophagitis - CMP14+EGFR; Future  3. GAD (generalized anxiety disorder) - CMP14+EGFR; Future  4. Hyperlipidemia associated with type 2 diabetes mellitus (HCC) - CMP14+EGFR; Future  5. Essential hypertension - CMP14+EGFR; Future  6. Depression, major, single episode, mild (HCC) - CMP14+EGFR; Future  7. Degeneration of intervertebral disc of lumbar region, unspecified whether pain present - CMP14+EGFR; Future  Pt will come tomorrow for labs Low carb diet  Continue current medications  Follow up in 3 months   Follow Up Instructions: I discussed  the assessment and treatment plan with the patient. The patient was provided an opportunity to ask questions and all were answered. The patient agreed with the plan and demonstrated an understanding of the instructions.  A copy of instructions were sent to the patient via MyChart unless otherwise noted below.     The patient was advised to call back or seek an in-person evaluation if the symptoms worsen or if the condition fails to improve as anticipated.    [name], FNP

## 2023-05-26 ENCOUNTER — Other Ambulatory Visit: Payer: Self-pay | Admitting: Family

## 2023-05-26 DIAGNOSIS — K219 Gastro-esophageal reflux disease without esophagitis: Secondary | ICD-10-CM

## 2023-05-29 ENCOUNTER — Other Ambulatory Visit: Payer: BC Managed Care – PPO

## 2023-05-29 DIAGNOSIS — F320 Major depressive disorder, single episode, mild: Secondary | ICD-10-CM

## 2023-05-29 DIAGNOSIS — I10 Essential (primary) hypertension: Secondary | ICD-10-CM

## 2023-05-29 DIAGNOSIS — F411 Generalized anxiety disorder: Secondary | ICD-10-CM

## 2023-05-29 DIAGNOSIS — K219 Gastro-esophageal reflux disease without esophagitis: Secondary | ICD-10-CM

## 2023-05-29 DIAGNOSIS — E785 Hyperlipidemia, unspecified: Secondary | ICD-10-CM

## 2023-05-29 DIAGNOSIS — M51369 Other intervertebral disc degeneration, lumbar region without mention of lumbar back pain or lower extremity pain: Secondary | ICD-10-CM

## 2023-05-29 DIAGNOSIS — E1169 Type 2 diabetes mellitus with other specified complication: Secondary | ICD-10-CM

## 2023-05-29 LAB — CMP14+EGFR
ALT: 14 [IU]/L (ref 0–32)
AST: 12 [IU]/L (ref 0–40)
Albumin: 4.3 g/dL (ref 3.9–4.9)
Alkaline Phosphatase: 80 [IU]/L (ref 44–121)
BUN/Creatinine Ratio: 23 (ref 9–23)
BUN: 14 mg/dL (ref 6–24)
Bilirubin Total: 0.2 mg/dL (ref 0.0–1.2)
CO2: 21 mmol/L (ref 20–29)
Calcium: 10 mg/dL (ref 8.7–10.2)
Chloride: 100 mmol/L (ref 96–106)
Creatinine, Ser: 0.6 mg/dL (ref 0.57–1.00)
Globulin, Total: 2.3 g/dL (ref 1.5–4.5)
Glucose: 231 mg/dL — ABNORMAL HIGH (ref 70–99)
Potassium: 4.6 mmol/L (ref 3.5–5.2)
Sodium: 136 mmol/L (ref 134–144)
Total Protein: 6.6 g/dL (ref 6.0–8.5)
eGFR: 113 mL/min/{1.73_m2} (ref >59)

## 2023-05-29 LAB — BAYER DCA HB A1C WAIVED: HB A1C (BAYER DCA - WAIVED): 8.9 % — ABNORMAL HIGH (ref 4.8–5.6)

## 2023-05-30 ENCOUNTER — Other Ambulatory Visit: Payer: Self-pay | Admitting: Family

## 2023-05-30 DIAGNOSIS — E1169 Type 2 diabetes mellitus with other specified complication: Secondary | ICD-10-CM

## 2023-05-30 LAB — MICROALBUMIN / CREATININE URINE RATIO
Creatinine, Urine: 73.9 mg/dL
Microalb/Creat Ratio: 24 mg/g{creat} (ref 0–29)
Microalbumin, Urine: 17.4 ug/mL

## 2023-05-30 MED ORDER — TRESIBA FLEXTOUCH 200 UNIT/ML ~~LOC~~ SOPN
20.0000 [IU] | PEN_INJECTOR | Freq: Every evening | SUBCUTANEOUS | 2 refills | Status: DC
Start: 2023-05-30 — End: 2023-07-31

## 2023-06-14 ENCOUNTER — Other Ambulatory Visit: Payer: Self-pay | Admitting: Family

## 2023-06-14 NOTE — Telephone Encounter (Signed)
Copied from CRM [id number]. Topic: Clinical - Medication Refill >> Jun 14, 2023  5:00 PM Antony Haste wrote: Most Recent Primary Care Visit:  Provider: [facility]  Department: [facility]  Visit Type: LAB VISIT  Date: 05/29/2023  Medication: Blood Glucose Monitoring Suppl (ONETOUCH VERIO) w/Device KIT  Has the patient contacted their pharmacy? Yes (Agent: If no, request that the patient contact the pharmacy for the refill. If patient does not wish to contact the pharmacy document the reason why and proceed with request.) (Agent: If yes, when and what did the pharmacy advise?)  Is this the correct pharmacy for this prescription? Yes If no, delete pharmacy and type the correct one.  This is the patient's preferred pharmacy:  [facility] [address], [place] - [address] [place] [address] [place] [address] Phone: [phone] Fax: [phone]   Has the prescription been filled recently? No, last refill was 01/24/2023, PT is requesting this again due to insurance changes.  Is the patient out of the medication? Yes  Has the patient been seen for an appointment in the last year OR does the patient have an upcoming appointment? Yes  Can we respond through MyChart? No  Agent: Please be advised that Rx refills may take up to 3 business days. We ask that you follow-up with your pharmacy.

## 2023-06-14 NOTE — Telephone Encounter (Signed)
Last Fill: 07/04/19  Last OV: 05/25/23 Next OV: 07/28/23  Routing to provider for review/authorization.

## 2023-06-15 ENCOUNTER — Telehealth: Payer: Self-pay

## 2023-06-15 ENCOUNTER — Other Ambulatory Visit: Payer: Self-pay | Admitting: Family

## 2023-06-15 DIAGNOSIS — E1169 Type 2 diabetes mellitus with other specified complication: Secondary | ICD-10-CM

## 2023-06-15 MED ORDER — ONETOUCH VERIO W/DEVICE KIT: 1.0000 | PACK | Freq: Three times a day (TID) | 11 refills | Status: AC

## 2023-06-15 NOTE — Telephone Encounter (Signed)
Copied from CRM [id number]. Topic: Clinical - Medication Refill >> Jun 15, 2023 12:39 PM Fonda Kinder J wrote: Most Recent Primary Care Visit:  Provider: [facility]  Department: [facility]  Visit Type: LAB VISIT  Date: 05/29/2023  Medication: Continuous Blood Gluc Sensor (FREESTYLE LIBRE SENSOR SYSTEM)  Has the patient contacted their pharmacy? Yes (Agent: If no, request that the patient contact the pharmacy for the refill. If patient does not wish to contact the pharmacy document the reason why and proceed with request.) (Agent: If yes, when and what did the pharmacy advise?) Contact PCP  Is this the correct pharmacy for this prescription? Yes If no, delete pharmacy and type the correct one.  This is the patient's preferred pharmacy:  [facility] [address] [place] - [address] [place] [address] [place] [address] Phone: [phone] Fax: [phone]   Has the prescription been filled recently? No  Is the patient out of the medication? Yes  Has the patient been seen for an appointment in the last year OR does the patient have an upcoming appointment? Yes  Can we respond through MyChart? Yes  Agent: Please be advised that Rx refills may take up to 3 business days. We ask that you follow-up with your pharmacy.

## 2023-06-15 NOTE — Telephone Encounter (Signed)
Copied from CRM [id number]. Topic: Clinical - Prescription Issue >> Jun 15, 2023 12:52 PM Fonda Kinder J wrote: Reason for CRM: Pt called in to get a refill on  Continuous Blood Gluc Sensor (FREESTYLE LIBRE SENSOR SYSTEM) MISC but Epic is stating it is no longer available and to place a new order. I do not have authorization to do so, please advise

## 2023-06-16 MED ORDER — FREESTYLE LIBRE 3 SENSOR MISC
2 refills | Status: DC
Start: 2023-06-16 — End: 2023-12-08

## 2023-06-16 NOTE — Telephone Encounter (Signed)
Libre refill Prescription sent to pharmacy

## 2023-06-16 NOTE — Telephone Encounter (Signed)
Patient aware and verbalized understanding.

## 2023-06-16 NOTE — Addendum Note (Signed)
Addended by: [name] A on: 06/16/2023 12:52 PM   Modules accepted: Orders

## 2023-06-29 ENCOUNTER — Other Ambulatory Visit: Payer: Self-pay | Admitting: Family

## 2023-06-29 DIAGNOSIS — F411 Generalized anxiety disorder: Secondary | ICD-10-CM

## 2023-06-29 DIAGNOSIS — F320 Major depressive disorder, single episode, mild: Secondary | ICD-10-CM

## 2023-07-07 ENCOUNTER — Other Ambulatory Visit: Payer: Self-pay | Admitting: Family

## 2023-07-07 DIAGNOSIS — E1169 Type 2 diabetes mellitus with other specified complication: Secondary | ICD-10-CM

## 2023-07-13 ENCOUNTER — Other Ambulatory Visit: Payer: Self-pay | Admitting: Family

## 2023-07-13 DIAGNOSIS — F320 Major depressive disorder, single episode, mild: Secondary | ICD-10-CM

## 2023-07-13 DIAGNOSIS — F411 Generalized anxiety disorder: Secondary | ICD-10-CM

## 2023-07-15 ENCOUNTER — Other Ambulatory Visit: Payer: Self-pay | Admitting: Family

## 2023-07-15 DIAGNOSIS — F320 Major depressive disorder, single episode, mild: Secondary | ICD-10-CM

## 2023-07-15 DIAGNOSIS — F411 Generalized anxiety disorder: Secondary | ICD-10-CM

## 2023-07-20 ENCOUNTER — Other Ambulatory Visit: Payer: Self-pay | Admitting: Family

## 2023-07-20 DIAGNOSIS — M51369 Other intervertebral disc degeneration, lumbar region without mention of lumbar back pain or lower extremity pain: Secondary | ICD-10-CM

## 2023-07-20 DIAGNOSIS — F411 Generalized anxiety disorder: Secondary | ICD-10-CM

## 2023-07-20 DIAGNOSIS — M4316 Spondylolisthesis, lumbar region: Secondary | ICD-10-CM

## 2023-07-20 DIAGNOSIS — F320 Major depressive disorder, single episode, mild: Secondary | ICD-10-CM

## 2023-07-28 ENCOUNTER — Ambulatory Visit: Payer: BC Managed Care – PPO | Admitting: Family

## 2023-07-28 VITALS — BP 130/75 | HR 72 | Ht 64.0 in | Wt 304.6 lb

## 2023-07-28 DIAGNOSIS — I10 Essential (primary) hypertension: Secondary | ICD-10-CM | POA: Diagnosis not present

## 2023-07-28 DIAGNOSIS — Z7984 Long term (current) use of oral hypoglycemic drugs: Secondary | ICD-10-CM

## 2023-07-28 DIAGNOSIS — F320 Major depressive disorder, single episode, mild: Secondary | ICD-10-CM

## 2023-07-28 DIAGNOSIS — E1169 Type 2 diabetes mellitus with other specified complication: Secondary | ICD-10-CM | POA: Diagnosis not present

## 2023-07-28 DIAGNOSIS — M1A9XX0 Chronic gout, unspecified, without tophus (tophi): Secondary | ICD-10-CM

## 2023-07-28 DIAGNOSIS — E559 Vitamin D deficiency, unspecified: Secondary | ICD-10-CM

## 2023-07-28 DIAGNOSIS — E785 Hyperlipidemia, unspecified: Secondary | ICD-10-CM

## 2023-07-28 DIAGNOSIS — F411 Generalized anxiety disorder: Secondary | ICD-10-CM

## 2023-07-28 DIAGNOSIS — M51369 Other intervertebral disc degeneration, lumbar region without mention of lumbar back pain or lower extremity pain: Secondary | ICD-10-CM | POA: Diagnosis not present

## 2023-07-28 DIAGNOSIS — K219 Gastro-esophageal reflux disease without esophagitis: Secondary | ICD-10-CM

## 2023-07-28 LAB — BAYER DCA HB A1C WAIVED: HB A1C (BAYER DCA - WAIVED): 8.7 % — ABNORMAL HIGH (ref 4.8–5.6)

## 2023-07-28 MED ORDER — METFORMIN HCL 1000 MG PO TABS: 1000.0000 mg | ORAL_TABLET | Freq: Two times a day (BID) | ORAL | 2 refills | Status: DC

## 2023-07-28 MED ORDER — LOSARTAN POTASSIUM 50 MG PO TABS: 50.0000 mg | ORAL_TABLET | Freq: Every day | ORAL | 3 refills | Status: DC

## 2023-07-28 MED ORDER — ALLOPURINOL 100 MG PO TABS: 200.0000 mg | ORAL_TABLET | Freq: Every day | ORAL | 3 refills | Status: AC

## 2023-07-28 NOTE — Progress Notes (Signed)
 Subjective:    Patient ID: Glenda Mitchell, female    DOB: May 20, 1975, 48 y.o.   MRN: [id number]  Chief Complaint  Patient presents with   Diabetes    Per last lab A1C elevated- Need to increase Tresiba to 20 units from 16 units, low carb diet, need follow up in 2 months   PT presents the office today for chronic follow up. She is taking Mounjaro 15 mg.      07/28/2023    9:05 AM 01/24/2023    8:43 AM 01/09/2023    1:05 PM  Last 3 Weights  Weight (lbs) 304 lb 9.6 oz 300 lb 6.4 oz 298 lb 6 oz  Weight (kg) 138.166 kg 136.261 kg 135.342 kg    She has gout and taking allopurinol 100 mg. Last gout flare up was years ago.   Hypertension This is a chronic problem. The current episode started more than 1 year ago. The problem has been resolved since onset. The problem is controlled. Associated symptoms include anxiety. Pertinent negatives include no blurred vision, malaise/fatigue, peripheral edema or shortness of breath. Risk factors for coronary artery disease include dyslipidemia, diabetes mellitus, obesity and sedentary lifestyle. The current treatment provides moderate improvement.  Gastroesophageal Reflux She complains of belching and heartburn. This is a chronic problem. The current episode started more than 1 year ago. The problem occurs occasionally. The symptoms are aggravated by certain foods. Risk factors include obesity. She has tried a PPI for the symptoms. The treatment provided moderate relief.  Hyperlipidemia This is a chronic problem. The current episode started more than 1 year ago. The problem is uncontrolled. Recent lipid tests were reviewed and are high. Exacerbating diseases include obesity. Pertinent negatives include no shortness of breath. Current antihyperlipidemic treatment includes statins. The current treatment provides moderate improvement of lipids. Risk factors for coronary artery disease include diabetes mellitus, hypertension, post-menopausal, a sedentary  lifestyle and dyslipidemia.  Diabetes She presents for her follow-up diabetic visit. She has type 2 diabetes mellitus. Hypoglycemia symptoms include nervousness/anxiousness. Associated symptoms include foot paresthesias. Pertinent negatives for diabetes include no blurred vision. Symptoms are stable. Diabetic complications include peripheral neuropathy. Risk factors for coronary artery disease include dyslipidemia, diabetes mellitus, hypertension, sedentary lifestyle and post-menopausal. She is following a generally unhealthy diet. Her overall blood glucose range is 180-200 mg/dl. Eye exam is current.  Anxiety Presents for follow-up visit. Symptoms include excessive worry and nervous/anxious behavior. Patient reports no shortness of breath. Symptoms occur rarely. The severity of symptoms is mild.    Depression        This is a chronic problem.  The current episode started more than 1 year ago.   The problem occurs rarely.  Associated symptoms include no helplessness, no hopelessness and not sad.  Past treatments include SSRIs - Selective serotonin reuptake inhibitors.  Past medical history includes anxiety.   Back Pain This is a chronic problem. The current episode started more than 1 year ago. The problem occurs intermittently. The problem has been waxing and waning since onset. The pain is present in the gluteal and lumbar spine. The quality of the pain is described as aching. The pain is at a severity of 1/10. The pain is moderate. The symptoms are aggravated by bending and standing. Risk factors include obesity. She has tried NSAIDs for the symptoms. The treatment provided mild relief.      Review of Systems  Constitutional:  Negative for malaise/fatigue.  Eyes:  Negative for blurred vision.  Respiratory:  Negative for shortness of breath.   Gastrointestinal:  Positive for heartburn.  Musculoskeletal:  Positive for back pain.  Psychiatric/Behavioral:  The patient is nervous/anxious.   All  other systems reviewed and are negative.      Objective:   Physical Exam Vitals reviewed.  Constitutional:      General: She is not in acute distress.    Appearance: She is well-developed. She is obese.  HENT:     Head: Normocephalic and atraumatic.     Right Ear: Tympanic membrane normal.     Left Ear: Tympanic membrane normal.  Eyes:     Pupils: Pupils are equal, round, and reactive to light.  Neck:     Thyroid: No thyromegaly.  Cardiovascular:     Rate and Rhythm: Normal rate and regular rhythm.     Heart sounds: Normal heart sounds. No murmur heard. Pulmonary:     Effort: Pulmonary effort is normal. No respiratory distress.     Breath sounds: Normal breath sounds. No wheezing.  Abdominal:     General: Bowel sounds are normal. There is no distension.     Palpations: Abdomen is soft.     Tenderness: There is no abdominal tenderness.  Musculoskeletal:        General: No tenderness.     Cervical back: Normal range of motion and neck supple.       Legs:     Comments: Pain in right lower lumbar with flexion and extension  Skin:    General: Skin is warm and dry.  Neurological:     Mental Status: She is alert and oriented to person, place, and time.     Cranial Nerves: No cranial nerve deficit.     Deep Tendon Reflexes: Reflexes are normal and symmetric.  Psychiatric:        Behavior: Behavior normal.        Thought Content: Thought content normal.        Judgment: Judgment normal.     Diabetic Foot Exam - Simple   Simple Foot Form Diabetic Foot exam was performed with the following findings: Yes 07/28/2023  9:29 AM  Visual Inspection No deformities, no ulcerations, no other skin breakdown bilaterally: Yes Sensation Testing Intact to touch and monofilament testing bilaterally: Yes Pulse Check Posterior Tibialis and Dorsalis pulse intact bilaterally: Yes Comments      BP (!) 128/90   Pulse 72   Ht 5\' 4"  (1.626 m)   Wt (!) 304 lb 9.6 oz (138.2 kg)   SpO2 96%    BMI 52.28 kg/m      Assessment & Plan:  Glenda Mitchell comes in today with chief complaint of Diabetes (Per last lab A1C elevated- Need to increase Tresiba to 20 units from 16 units, low carb diet, need follow up in 2 months)   Diagnosis and orders addressed:  1. Type 2 diabetes mellitus with other specified complication, without long-term current use of insulin (HCC) (Primary) - Bayer DCA Hb A1c Waived - CMP14+EGFR - metFORMIN (GLUCOPHAGE) 1000 MG tablet; Take 1 tablet (1,000 mg total) by mouth 2 (two) times daily.  Dispense: 180 tablet; Refill: 2  2. Essential hypertension - CMP14+EGFR - losartan (COZAAR) 50 MG tablet; Take 1 tablet (50 mg total) by mouth daily.  Dispense: 90 tablet; Refill: 3  3. Depression, major, single episode, mild (HCC) - CMP14+EGFR  4. Degeneration of intervertebral disc of lumbar region, unspecified whether pain present - CMP14+EGFR  5. GAD (generalized anxiety disorder) - CMP14+EGFR  6. Gastroesophageal reflux disease without esophagitis  - CMP14+EGFR  7. Hyperlipidemia associated with type 2 diabetes mellitus (HCC) - CMP14+EGFR  8. Vitamin D deficiency  - CMP14+EGFR  9. Chronic gout without tophus, unspecified cause, unspecified site - allopurinol (ZYLOPRIM) 100 MG tablet; Take 2 tablets (200 mg total) by mouth daily.  Dispense: 180 tablet; Refill: 3   Labs pending Continue current medications  Health Maintenance reviewed Diet and exercise encouraged  Follow up plan: 3 months    [name], FNP

## 2023-07-28 NOTE — Patient Instructions (Signed)

## 2023-07-29 LAB — CMP14+EGFR
ALT: 13 IU/L (ref 0–32)
AST: 15 IU/L (ref 0–40)
Albumin: 4.2 g/dL (ref 3.9–4.9)
Alkaline Phosphatase: 79 IU/L (ref 44–121)
BUN/Creatinine Ratio: 22 (ref 9–23)
BUN: 13 mg/dL (ref 6–24)
Bilirubin Total: 0.3 mg/dL (ref 0.0–1.2)
CO2: 22 mmol/L (ref 20–29)
Calcium: 9.8 mg/dL (ref 8.7–10.2)
Chloride: 99 mmol/L (ref 96–106)
Creatinine, Ser: 0.6 mg/dL (ref 0.57–1.00)
Globulin, Total: 2.3 g/dL (ref 1.5–4.5)
Glucose: 194 mg/dL — ABNORMAL HIGH (ref 70–99)
Potassium: 4.6 mmol/L (ref 3.5–5.2)
Sodium: 138 mmol/L (ref 134–144)
Total Protein: 6.5 g/dL (ref 6.0–8.5)
eGFR: 113 mL/min/{1.73_m2} (ref >59)

## 2023-07-31 ENCOUNTER — Other Ambulatory Visit: Payer: Self-pay | Admitting: Family

## 2023-07-31 DIAGNOSIS — E1169 Type 2 diabetes mellitus with other specified complication: Secondary | ICD-10-CM

## 2023-07-31 MED ORDER — TRESIBA FLEXTOUCH 200 UNIT/ML ~~LOC~~ SOPN: 25.0000 [IU] | PEN_INJECTOR | Freq: Every evening | SUBCUTANEOUS | 2 refills | Status: DC

## 2023-08-02 ENCOUNTER — Telehealth: Payer: Self-pay

## 2023-08-02 ENCOUNTER — Other Ambulatory Visit (HOSPITAL_COMMUNITY): Payer: Self-pay

## 2023-08-02 NOTE — Telephone Encounter (Signed)
*  Primary  Pharmacy Patient Advocate Encounter   Received notification from Fax that prior authorization for Evaristo Bury is required/requested.   Insurance verification completed.   The patient is insured through [facility] ADVANTAGE/RX ADVANCE .   Per test claim: The current 30 day co-pay is, $0.00.  No PA needed at this time. This test claim was processed through [facility]- copay amounts may vary at other pharmacies due to pharmacy/plan contracts, or as the patient moves through the different stages of their insurance plan.

## 2023-08-03 ENCOUNTER — Other Ambulatory Visit: Payer: Self-pay | Admitting: Family

## 2023-08-03 DIAGNOSIS — E1169 Type 2 diabetes mellitus with other specified complication: Secondary | ICD-10-CM

## 2023-08-09 ENCOUNTER — Telehealth: Payer: Self-pay | Admitting: Family

## 2023-08-09 NOTE — Telephone Encounter (Signed)
 Copied from CRM [id number]. Topic: Clinical - Prescription Issue >> Aug 09, 2023  8:44 AM Higinio Roger wrote: Reason for CRM: [facility] [phone] stated insulin degludec (TRESIBA FLEXTOUCH) 200 UNIT/ML FlexTouch Pen is not covered by patient's insurance and Lantus would need to be prescribed instead.

## 2023-08-10 ENCOUNTER — Telehealth: Payer: Self-pay

## 2023-08-10 MED ORDER — LANTUS SOLOSTAR 100 UNIT/ML ~~LOC~~ SOPN: 26.0000 [IU] | PEN_INJECTOR | Freq: Every day | SUBCUTANEOUS | 2 refills | Status: DC

## 2023-08-10 NOTE — Telephone Encounter (Signed)
 Spoke with patient about medication questions, patient verbalized understanding. Patient also stated has a "red lump" on left leg, would like an appointment to make sure it isnt a blood clot. Patient denies any chest pain or shortness of breath. Appointment scheduled.

## 2023-08-10 NOTE — Telephone Encounter (Signed)
 Tresiba changed to Lantus. New Prescription sent to pharmacy

## 2023-08-10 NOTE — Telephone Encounter (Signed)
 [email or web address]

## 2023-08-10 NOTE — Telephone Encounter (Signed)
 Copied from CRM [id number]. Topic: Clinical - Prescription Issue >> Aug 10, 2023  4:25 PM Geroge Baseman wrote: Reason for CRM: Patient is having trouble understanding why two different insulins were sent over to her pharmacy, she is wanting to clarify how she is supposed to be taking these medications.

## 2023-08-10 NOTE — Addendum Note (Signed)
 Addended by: [name] A on: 08/10/2023 12:02 PM   Modules accepted: Orders

## 2023-08-11 ENCOUNTER — Ambulatory Visit: Admitting: Family Medicine

## 2023-08-11 ENCOUNTER — Other Ambulatory Visit: Payer: Self-pay | Admitting: Family

## 2023-08-11 ENCOUNTER — Encounter: Payer: Self-pay | Admitting: Family Medicine

## 2023-08-11 VITALS — BP 113/80 | HR 81 | Temp 97.9°F | Ht 64.0 in | Wt 301.4 lb

## 2023-08-11 DIAGNOSIS — E1169 Type 2 diabetes mellitus with other specified complication: Secondary | ICD-10-CM | POA: Diagnosis not present

## 2023-08-11 DIAGNOSIS — Z7985 Long-term (current) use of injectable non-insulin antidiabetic drugs: Secondary | ICD-10-CM | POA: Diagnosis not present

## 2023-08-11 DIAGNOSIS — H6992 Unspecified Eustachian tube disorder, left ear: Secondary | ICD-10-CM

## 2023-08-11 DIAGNOSIS — M79662 Pain in left lower leg: Secondary | ICD-10-CM

## 2023-08-11 MED ORDER — TRESIBA FLEXTOUCH 100 UNIT/ML ~~LOC~~ SOPN: 26.0000 [IU] | PEN_INJECTOR | Freq: Every day | SUBCUTANEOUS | Status: DC

## 2023-08-11 NOTE — Progress Notes (Signed)
   Acute Office Visit  Subjective:     Patient ID: Glenda Mitchell, female    DOB: [birth year], 48 y.o.   MRN: [id number]  Chief Complaint  Patient presents with   Leg Pain    HPI Patient is in today for left leg pain. Reports a soreness to left lower anterior leg near her ankle for about 1 week. Achy with tenderness to the touch. Noticed a knot there 2 days ago. Scant amount of erythema. No warmth. No swelling or edema. No hx of DVT. Hasn't been bedridden or immobility. No active cancer or recent surgery. Doesn't recall an injury.   ROS As per HPI.      Objective:    BP 113/80   Pulse 81   Temp 97.9 F (36.6 C) (Temporal)   Ht 5\' 4"  (1.626 m)   Wt (!) 301 lb 6.4 oz (136.7 kg)   SpO2 97%   BMI 51.74 kg/m    Physical Exam Vitals and nursing note reviewed.  Constitutional:      General: She is not in acute distress.    Appearance: She is obese. She is not ill-appearing, toxic-appearing or diaphoretic.  Pulmonary:     Effort: Pulmonary effort is normal. No respiratory distress.  Musculoskeletal:     Right lower leg: No edema.     Left lower leg: No swelling, deformity, lacerations or bony tenderness. No edema.     Comments: Small cyst under skin with tenderness to palpation of left anterior distal lower leg. No erythema or warmth. No swelling or edema. No collateral veins present.   Skin:    General: Skin is warm and dry.     Coloration: Skin is not jaundiced.  Neurological:     Mental Status: She is alert and oriented to person, place, and time.  Psychiatric:        Mood and Affect: Mood normal.        Behavior: Behavior normal.     No results found for any visits on 08/11/23.      Assessment & Plan:   Glenda Mitchell was seen today for leg pain.  Diagnoses and all orders for this visit:  Pain in left lower leg Score of 0 on Well's criteria for DVT. No swelling, edema, warmth, or erythema. Small mobile cyst palpated. Discussed heat, compression, elevation.  Return to office for new or worsening symptoms, or if symptoms persist.   Type 2 diabetes mellitus with other specified complication, without long-term current use of insulin (HCC) Refill provided. Follow up with PCP for management.  -     insulin degludec (TRESIBA FLEXTOUCH) 100 UNIT/ML FlexTouch Pen; Inject 26 Units into the skin daily.  Return if symptoms worsen or fail to improve.  The patient indicates understanding of these issues and agrees with the plan.   Gabriel Earing, FNP

## 2023-09-29 ENCOUNTER — Ambulatory Visit: Admitting: Family

## 2023-09-29 ENCOUNTER — Encounter: Payer: Self-pay | Admitting: Family

## 2023-09-29 VITALS — BP 107/72 | HR 86 | Temp 97.6°F | Resp 18 | Ht 64.0 in | Wt 302.0 lb

## 2023-09-29 DIAGNOSIS — M1A9XX0 Chronic gout, unspecified, without tophus (tophi): Secondary | ICD-10-CM

## 2023-09-29 DIAGNOSIS — F411 Generalized anxiety disorder: Secondary | ICD-10-CM

## 2023-09-29 DIAGNOSIS — E1169 Type 2 diabetes mellitus with other specified complication: Secondary | ICD-10-CM

## 2023-09-29 DIAGNOSIS — F320 Major depressive disorder, single episode, mild: Secondary | ICD-10-CM

## 2023-09-29 DIAGNOSIS — Z1211 Encounter for screening for malignant neoplasm of colon: Secondary | ICD-10-CM

## 2023-09-29 DIAGNOSIS — I10 Essential (primary) hypertension: Secondary | ICD-10-CM

## 2023-09-29 DIAGNOSIS — K219 Gastro-esophageal reflux disease without esophagitis: Secondary | ICD-10-CM

## 2023-09-29 DIAGNOSIS — E282 Polycystic ovarian syndrome: Secondary | ICD-10-CM

## 2023-09-29 DIAGNOSIS — M51369 Other intervertebral disc degeneration, lumbar region without mention of lumbar back pain or lower extremity pain: Secondary | ICD-10-CM

## 2023-09-29 DIAGNOSIS — E559 Vitamin D deficiency, unspecified: Secondary | ICD-10-CM

## 2023-09-29 DIAGNOSIS — Z0001 Encounter for general adult medical examination with abnormal findings: Secondary | ICD-10-CM | POA: Diagnosis not present

## 2023-09-29 DIAGNOSIS — E785 Hyperlipidemia, unspecified: Secondary | ICD-10-CM

## 2023-09-29 DIAGNOSIS — Z0000 Encounter for general adult medical examination without abnormal findings: Secondary | ICD-10-CM

## 2023-09-29 LAB — LIPID PANEL

## 2023-09-29 LAB — BAYER DCA HB A1C WAIVED: HB A1C (BAYER DCA - WAIVED): 9.2 % — ABNORMAL HIGH (ref 4.8–5.6)

## 2023-09-29 MED ORDER — TRESIBA FLEXTOUCH 100 UNIT/ML ~~LOC~~ SOPN
30.0000 [IU] | PEN_INJECTOR | Freq: Every day | SUBCUTANEOUS | 2 refills | Status: DC
Start: 2023-09-29 — End: 2023-10-27

## 2023-09-29 MED ORDER — ESCITALOPRAM OXALATE 10 MG PO TABS: 10.0000 mg | ORAL_TABLET | Freq: Two times a day (BID) | ORAL | 2 refills | Status: DC

## 2023-09-29 NOTE — Progress Notes (Signed)
 Subjective:    Patient ID: Glenda Mitchell, female    DOB: 10-20-1975, 48 y.o.   MRN: [id number]  Chief Complaint  Patient presents with   Diabetes    Sugar has been staying up above 200 last too weeks. Patient had to take provera  and she read that could cause it to go up    PT presents the office today for CPE and chronic follow up.   She is followed by GYN annually for PCOS and pap.   She is taking Mounjaro  15 mg.      09/29/2023    9:08 AM 08/11/2023    8:58 AM 07/28/2023    9:05 AM  Last 3 Weights  Weight (lbs) 302 lb 301 lb 6.4 oz 304 lb 9.6 oz  Weight (kg) 136.986 kg 136.714 kg 138.166 kg    She has gout and taking allopurinol  100 mg. Last gout flare up was years ago.   Hypertension This is a chronic problem. The current episode started more than 1 year ago. The problem has been resolved since onset. The problem is controlled. Associated symptoms include anxiety. Pertinent negatives include no blurred vision, malaise/fatigue, peripheral edema or shortness of breath. Risk factors for coronary artery disease include dyslipidemia, diabetes mellitus, obesity and sedentary lifestyle. The current treatment provides moderate improvement.  Gastroesophageal Reflux She complains of belching and heartburn. This is a chronic problem. The current episode started more than 1 year ago. The problem occurs occasionally. The symptoms are aggravated by certain foods. Risk factors include obesity. She has tried a PPI for the symptoms. The treatment provided moderate relief.  Hyperlipidemia This is a chronic problem. The current episode started more than 1 year ago. The problem is uncontrolled. Recent lipid tests were reviewed and are high. Exacerbating diseases include obesity. Pertinent negatives include no shortness of breath. Current antihyperlipidemic treatment includes statins. The current treatment provides moderate improvement of lipids. Risk factors for coronary artery disease include  diabetes mellitus, hypertension, post-menopausal, a sedentary lifestyle, dyslipidemia and obesity.  Diabetes She presents for her follow-up diabetic visit. She has type 2 diabetes mellitus. Hypoglycemia symptoms include nervousness/anxiousness. Associated symptoms include foot paresthesias. Pertinent negatives for diabetes include no blurred vision. Symptoms are stable. Diabetic complications include peripheral neuropathy. Risk factors for coronary artery disease include dyslipidemia, diabetes mellitus, hypertension, sedentary lifestyle and post-menopausal. She is following a generally unhealthy diet. Her overall blood glucose range is >200 mg/dl. Eye exam is current.  Anxiety Presents for follow-up visit. Symptoms include excessive worry and nervous/anxious behavior. Patient reports no shortness of breath or suicidal ideas. Symptoms occur occasionally. The severity of symptoms is mild.    Depression        This is a chronic problem.  The current episode started more than 1 year ago.   The problem occurs rarely.  Associated symptoms include decreased interest and sad.  Associated symptoms include no helplessness, no hopelessness and no suicidal ideas.  Past treatments include SSRIs - Selective serotonin reuptake inhibitors.  Past medical history includes anxiety.   Back Pain This is a chronic problem. The current episode started more than 1 year ago. The problem occurs intermittently. The problem has been waxing and waning since onset. The pain is present in the gluteal and lumbar spine. The quality of the pain is described as aching. The pain is at a severity of 3/10. The pain is moderate. The symptoms are aggravated by bending and standing. Risk factors include obesity. She has tried NSAIDs for the  symptoms. The treatment provided mild relief.      Review of Systems  Constitutional:  Negative for malaise/fatigue.  Eyes:  Negative for blurred vision.  Respiratory:  Negative for shortness of  breath.   Gastrointestinal:  Positive for heartburn.  Musculoskeletal:  Positive for back pain.  Psychiatric/Behavioral:  Negative for suicidal ideas. The patient is nervous/anxious.   All other systems reviewed and are negative.  Family History  Problem Relation Age of Onset   Diabetes Mother    Cancer Mother        breast/lung   Heart attack Father    Hypertension Father    Diabetes Sister    Heart disease Sister    Cancer Maternal Grandmother        lung   Kidney disease Sister    Social History   Socioeconomic History   Marital status: Married    Spouse name: Not on file   Number of children: 3   Years of education: Not on file   Highest education level: GED or equivalent  Occupational History   Not on file  Tobacco Use   Smoking status: Never   Smokeless tobacco: Never  Vaping Use   Vaping status: Never Used  Substance and Sexual Activity   Alcohol use: Never   Drug use: Never   Sexual activity: Yes    Birth control/protection: None  Other Topics Concern   Not on file  Social History Narrative   Not on file   Social Drivers of Health   Financial Resource Strain: Low Risk  (07/28/2023)   Overall Financial Resource Strain (CARDIA)    Difficulty of Paying Living Expenses: Not hard at all  Food Insecurity: No Food Insecurity (07/28/2023)   Hunger Vital Sign    Worried About Running Out of Food in the Last Year: Never true    Ran Out of Food in the Last Year: Never true  Transportation Needs: No Transportation Needs (07/28/2023)   PRAPARE - Administrator, Civil Service (Medical): No    Lack of Transportation (Non-Medical): No  Physical Activity: Insufficiently Active (07/28/2023)   Exercise Vital Sign    Days of Exercise per Week: 1 day    Minutes of Exercise per Session: 10 min  Stress: No Stress Concern Present (07/28/2023)   [employer] of Occupational Health - Occupational Stress Questionnaire    Feeling of Stress : Not at all  Social  Connections: Socially Integrated (07/28/2023)   Social Connection and Isolation Panel [NHANES]    Frequency of Communication with Friends and Family: More than three times a week    Frequency of Social Gatherings with Friends and Family: Three times a week    Attends Religious Services: More than 4 times per year    Active Member of Clubs or Organizations: Yes    Attends Banker Meetings: More than 4 times per year    Marital Status: Married       Objective:   Physical Exam Vitals reviewed.  Constitutional:      General: She is not in acute distress.    Appearance: She is well-developed. She is obese.  HENT:     Head: Normocephalic and atraumatic.     Right Ear: Tympanic membrane normal.     Left Ear: Tympanic membrane normal.  Eyes:     Pupils: Pupils are equal, round, and reactive to light.  Neck:     Thyroid: No thyromegaly.  Cardiovascular:     Rate and  Rhythm: Normal rate and regular rhythm.     Heart sounds: Normal heart sounds. No murmur heard. Pulmonary:     Effort: Pulmonary effort is normal. No respiratory distress.     Breath sounds: Normal breath sounds. No wheezing.  Abdominal:     General: Bowel sounds are normal. There is no distension.     Palpations: Abdomen is soft.     Tenderness: There is no abdominal tenderness.  Musculoskeletal:        General: No tenderness.     Cervical back: Normal range of motion and neck supple.     Comments: Pain in right lower lumbar with flexion and extension  Skin:    General: Skin is warm and dry.  Neurological:     Mental Status: She is alert and oriented to person, place, and time.     Cranial Nerves: No cranial nerve deficit.     Deep Tendon Reflexes: Reflexes are normal and symmetric.  Psychiatric:        Behavior: Behavior normal.        Thought Content: Thought content normal.        Judgment: Judgment normal.       Ht 5\' 4"  (1.626 m)   Wt (!) 302 lb (137 kg)   BMI 51.84 kg/m      Assessment  & Plan:  KRISHANA LUTZE comes in today with chief complaint of Diabetes (Sugar has been staying up above 200 last too weeks. Patient had to take provera  and she read that could cause it to go up )   Diagnosis and orders addressed:  1. Type 2 diabetes mellitus with other specified complication, without long-term current use of insulin  (HCC) - insulin  degludec (TRESIBA  FLEXTOUCH) 100 UNIT/ML FlexTouch Pen; Inject 30 Units into the skin daily.  Dispense: 9 mL; Refill: 2 - Bayer DCA Hb A1c Waived - CMP14+EGFR - CBC with Differential/Platelet - TSH - Vitamin B12  2. Chronic gout without tophus, unspecified cause, unspecified site - CMP14+EGFR - CBC with Differential/Platelet  3. Gastroesophageal reflux disease without esophagitis - CMP14+EGFR - CBC with Differential/Platelet  4. GAD (generalized anxiety disorder)  - CMP14+EGFR - CBC with Differential/Platelet - escitalopram  (LEXAPRO ) 10 MG tablet; Take 1 tablet (10 mg total) by mouth 2 (two) times daily.  Dispense: 180 tablet; Refill: 2  5. Hyperlipidemia associated with type 2 diabetes mellitus (HCC) - CMP14+EGFR - CBC with Differential/Platelet - Lipid panel  6. Essential hypertension - CMP14+EGFR - CBC with Differential/Platelet - TSH  7. Depression, major, single episode, mild (HCC)  - CMP14+EGFR - CBC with Differential/Platelet - escitalopram  (LEXAPRO ) 10 MG tablet; Take 1 tablet (10 mg total) by mouth 2 (two) times daily.  Dispense: 180 tablet; Refill: 2  8. Degeneration of intervertebral disc of lumbar region, unspecified whether pain present - CMP14+EGFR - CBC with Differential/Platelet  9. PCOS (polycystic ovarian syndrome)  - CMP14+EGFR - CBC with Differential/Platelet - TSH  10. Vitamin D  deficiency - CMP14+EGFR - CBC with Differential/Platelet - VITAMIN D  25 Hydroxy (Vit-D Deficiency, Fractures)  11. Colon cancer screening  - Ambulatory referral to Gastroenterology - CMP14+EGFR - CBC with  Differential/Platelet  12. Annual physical exam (Primary) - CBC with Differential/Platelet - Lipid panel - TSH - Vitamin B12   Labs pending Will increase Tresiba  to 30 units from 26, ok to increase 3 units every 3 days until glucose <180 in AM.  Continue current medications  Health Maintenance reviewed Diet and exercise encouraged  Follow up plan: 2 months  for uncontrolled DM   Tommas Fragmin, FNP

## 2023-09-29 NOTE — Patient Instructions (Signed)

## 2023-09-30 LAB — CMP14+EGFR
ALT: 14 IU/L (ref 0–32)
AST: 14 IU/L (ref 0–40)
Albumin: 4.4 g/dL (ref 3.9–4.9)
Alkaline Phosphatase: 84 IU/L (ref 44–121)
BUN/Creatinine Ratio: 26 — ABNORMAL HIGH (ref 9–23)
BUN: 16 mg/dL (ref 6–24)
Bilirubin Total: 0.4 mg/dL (ref 0.0–1.2)
CO2: 23 mmol/L (ref 20–29)
Calcium: 10 mg/dL (ref 8.7–10.2)
Chloride: 97 mmol/L (ref 96–106)
Creatinine, Ser: 0.61 mg/dL (ref 0.57–1.00)
Globulin, Total: 2.4 g/dL (ref 1.5–4.5)
Glucose: 220 mg/dL — ABNORMAL HIGH (ref 70–99)
Potassium: 4.8 mmol/L (ref 3.5–5.2)
Sodium: 135 mmol/L (ref 134–144)
Total Protein: 6.8 g/dL (ref 6.0–8.5)
eGFR: 112 mL/min/{1.73_m2} (ref >59)

## 2023-09-30 LAB — CBC WITH DIFFERENTIAL/PLATELET
Basophils Absolute: 0.1 10*3/uL (ref 0.0–0.2)
Basos: 1 %
EOS (ABSOLUTE): 0.3 10*3/uL (ref 0.0–0.4)
Eos: 3 %
Hematocrit: 40.4 % (ref 34.0–46.6)
Hemoglobin: 12.8 g/dL (ref 11.1–15.9)
Immature Grans (Abs): 0 10*3/uL (ref 0.0–0.1)
Immature Granulocytes: 0 %
Lymphocytes Absolute: 2.7 10*3/uL (ref 0.7–3.1)
Lymphs: 30 %
MCH: 26.8 pg (ref 26.6–33.0)
MCHC: 31.7 g/dL (ref 31.5–35.7)
MCV: 85 fL (ref 79–97)
Monocytes Absolute: 0.6 10*3/uL (ref 0.1–0.9)
Monocytes: 7 %
Neutrophils Absolute: 5.4 10*3/uL (ref 1.4–7.0)
Neutrophils: 59 %
Platelets: 312 10*3/uL (ref 150–450)
RBC: 4.77 x10E6/uL (ref 3.77–5.28)
RDW: 14.6 % (ref 11.7–15.4)
WBC: 9.1 10*3/uL (ref 3.4–10.8)

## 2023-09-30 LAB — VITAMIN B12: Vitamin B-12: 359 pg/mL (ref 232–1245)

## 2023-09-30 LAB — LIPID PANEL
Chol/HDL Ratio: 4.2 ratio (ref 0.0–4.4)
Cholesterol, Total: 160 mg/dL (ref 100–199)
HDL: 38 mg/dL — ABNORMAL LOW (ref >39)
LDL Chol Calc (NIH): 78 mg/dL (ref 0–99)
Triglycerides: 270 mg/dL — ABNORMAL HIGH (ref 0–149)
VLDL Cholesterol Cal: 44 mg/dL — ABNORMAL HIGH (ref 5–40)

## 2023-09-30 LAB — VITAMIN D 25 HYDROXY (VIT D DEFICIENCY, FRACTURES): Vit D, 25-Hydroxy: 30.2 ng/mL (ref 30.0–100.0)

## 2023-09-30 LAB — TSH: TSH: 3.48 u[IU]/mL (ref 0.450–4.500)

## 2023-10-02 ENCOUNTER — Other Ambulatory Visit (HOSPITAL_BASED_OUTPATIENT_CLINIC_OR_DEPARTMENT_OTHER): Payer: Self-pay

## 2023-10-02 ENCOUNTER — Ambulatory Visit: Payer: Self-pay | Admitting: Family

## 2023-10-12 ENCOUNTER — Ambulatory Visit: Payer: Self-pay

## 2023-10-12 NOTE — Telephone Encounter (Signed)
 FYI Only or Action Required?: FYI only for provider  Patient was last seen in primary care on 09/29/2023 by Glenda Hem, FNP. Called Nurse Triage reporting Pelvic Pain. Symptoms began several days ago. Interventions attempted: OTC medications: Midol and acetaminophen. Symptoms are: gradually worsening.  Triage Disposition: Go to ED Now (Notify PCP)  Patient/caregiver understands and will follow disposition?: unsure pt wants to go to UC first.      Copied from CRM [id number]. Topic: Clinical - Red Word Triage >> Oct 12, 2023  4:19 PM Tiffany H wrote: Red Word that prompted transfer to Nurse Triage: Patient called to advise that after menstrual cycle, she has continued cramping and pain. No discharge. There's no anticipating how long it'll last. Feels like a lot of pressure (between ovaries). Reason for Disposition  [1] SEVERE pelvic pain AND [2] present > 1 hour  Answer Assessment - Initial Assessment Questions 1. LOCATION: Where does it hurt?      Pelvic cramping like before  2. RADIATION: Does the pain shoot anywhere else? (e.g., lower back, groin, thighs)     no 3. ONSET: When did the pain begin? (e.g., minutes, hours or days ago)      Monday  4. SUDDEN: Gradual or sudden onset?     gradual 5. PATTERN Does the pain come and go, or is it constant?    - If constant: Is it getting better, staying the same, or worsening?      (Note: Constant means the pain never goes away completely; most serious pain is constant and gets worse over time)     - If intermittent: How long does it last? Do you have pain now?     (Note: Intermittent means the pain goes away completely between bouts)     Constant x 1 hour then comes and goes 6. SEVERITY: How bad is the pain?  (e.g., Scale 1-10; mild, moderate, or severe)   - MILD (1-3): doesn't interfere with normal activities, area soft and not tender to touch    - MODERATE (4-7): interferes with normal activities or awakens from sleep,  abdomen tender to touch    - SEVERE (8-10): excruciating pain, doubled over, unable to do any normal activities      Pressure middle pelvic then to ovaries 8/10 7. RECURRENT SYMPTOM: Have you ever had this type of pelvic pain before? If Yes, ask: When was the last time? and What happened that time?      no 8. CAUSE: What do you think is causing the pelvic pain?     ? UTI 9. RELIEVING/AGGRAVATING FACTORS: What makes it better or worse? (e.g., activity/rest, sexual intercourse, voiding, passing stool)     Midol or Reg Tylenol, rest and drink water  10. OTHER SYMPTOMS: Has there been any other symptoms? (e.g., fever, constipation, diarrhea, urine problems, vaginal bleeding, vaginal discharge, or vomiting?       Nausea with severe pain 11. PREGNANCY: Is there any chance you are pregnant? When was your last menstrual period?       N/a  Protocols used: Pelvic Pain - Female-A-AH

## 2023-10-16 ENCOUNTER — Other Ambulatory Visit: Payer: Self-pay | Admitting: Family

## 2023-10-16 DIAGNOSIS — M51369 Other intervertebral disc degeneration, lumbar region without mention of lumbar back pain or lower extremity pain: Secondary | ICD-10-CM

## 2023-10-16 DIAGNOSIS — M4316 Spondylolisthesis, lumbar region: Secondary | ICD-10-CM

## 2023-10-17 ENCOUNTER — Other Ambulatory Visit: Payer: Self-pay | Admitting: Family

## 2023-10-17 DIAGNOSIS — F320 Major depressive disorder, single episode, mild: Secondary | ICD-10-CM

## 2023-10-17 DIAGNOSIS — F411 Generalized anxiety disorder: Secondary | ICD-10-CM

## 2023-10-23 ENCOUNTER — Other Ambulatory Visit: Payer: Self-pay | Admitting: Family

## 2023-10-23 DIAGNOSIS — K219 Gastro-esophageal reflux disease without esophagitis: Secondary | ICD-10-CM

## 2023-10-27 ENCOUNTER — Ambulatory Visit: Admitting: Family

## 2023-10-27 ENCOUNTER — Telehealth: Payer: Self-pay | Admitting: Family Medicine

## 2023-10-27 ENCOUNTER — Other Ambulatory Visit: Payer: Self-pay | Admitting: Family

## 2023-10-27 ENCOUNTER — Encounter: Payer: Self-pay | Admitting: Family

## 2023-10-27 VITALS — BP 118/73 | HR 79 | Temp 97.8°F | Ht 64.0 in | Wt 307.0 lb

## 2023-10-27 DIAGNOSIS — M4316 Spondylolisthesis, lumbar region: Secondary | ICD-10-CM

## 2023-10-27 DIAGNOSIS — M51369 Other intervertebral disc degeneration, lumbar region without mention of lumbar back pain or lower extremity pain: Secondary | ICD-10-CM

## 2023-10-27 DIAGNOSIS — E1169 Type 2 diabetes mellitus with other specified complication: Secondary | ICD-10-CM | POA: Diagnosis not present

## 2023-10-27 DIAGNOSIS — E785 Hyperlipidemia, unspecified: Secondary | ICD-10-CM

## 2023-10-27 DIAGNOSIS — Z794 Long term (current) use of insulin: Secondary | ICD-10-CM

## 2023-10-27 DIAGNOSIS — I10 Essential (primary) hypertension: Secondary | ICD-10-CM

## 2023-10-27 DIAGNOSIS — M5412 Radiculopathy, cervical region: Secondary | ICD-10-CM | POA: Diagnosis not present

## 2023-10-27 DIAGNOSIS — K219 Gastro-esophageal reflux disease without esophagitis: Secondary | ICD-10-CM

## 2023-10-27 DIAGNOSIS — F320 Major depressive disorder, single episode, mild: Secondary | ICD-10-CM

## 2023-10-27 DIAGNOSIS — F411 Generalized anxiety disorder: Secondary | ICD-10-CM

## 2023-10-27 MED ORDER — TRESIBA FLEXTOUCH 100 UNIT/ML ~~LOC~~ SOPN: 40.0000 [IU] | PEN_INJECTOR | Freq: Every day | SUBCUTANEOUS | 2 refills | Status: DC

## 2023-10-27 MED ORDER — CELECOXIB 200 MG PO CAPS
200.0000 mg | ORAL_CAPSULE | Freq: Two times a day (BID) | ORAL | 0 refills | Status: DC
Start: 2023-10-27 — End: 2024-02-08

## 2023-10-27 MED ORDER — BACLOFEN 10 MG PO TABS: 10.0000 mg | ORAL_TABLET | Freq: Three times a day (TID) | ORAL | 1 refills | Status: DC

## 2023-10-27 MED ORDER — KETOROLAC TROMETHAMINE 60 MG/2ML IM SOLN
60.0000 mg | Freq: Once | INTRAMUSCULAR | Status: AC
  Administered 2023-10-27: 60 mg via INTRAMUSCULAR

## 2023-10-27 NOTE — Progress Notes (Signed)
 Subjective:    Patient ID: Glenda Mitchell, female    DOB: 12/29/1975, 48 y.o.   MRN: [id number]  Chief Complaint  Patient presents with   Shoulder Pain   Arm Pain    Started week and a half ago Back of arm and elbow hurts most Mentioned to chiropractor but no help Tingles sometimes  Pain does not ease up   PT presents the office today for chronic follow up.   She is followed by GYN annually for PCOS and pap.   She is taking Mounjaro  15 mg.      10/27/2023   12:05 PM 09/29/2023    9:08 AM 08/11/2023    8:58 AM  Last 3 Weights  Weight (lbs) 307 lb 302 lb 301 lb 6.4 oz  Weight (kg) 139.254 kg 136.986 kg 136.714 kg    She has gout and taking allopurinol  100 mg. Last gout flare up was years ago.   Hypertension This is a chronic problem. The current episode started more than 1 year ago. The problem has been resolved since onset. The problem is controlled. Associated symptoms include anxiety. Pertinent negatives include no blurred vision, malaise/fatigue, peripheral edema or shortness of breath. Risk factors for coronary artery disease include dyslipidemia, diabetes mellitus, obesity and sedentary lifestyle. The current treatment provides moderate improvement.  Gastroesophageal Reflux She complains of belching and heartburn. This is a chronic problem. The current episode started more than 1 year ago. The problem occurs occasionally. The symptoms are aggravated by certain foods. Risk factors include obesity. She has tried a PPI for the symptoms. The treatment provided moderate relief.  Hyperlipidemia This is a chronic problem. The current episode started more than 1 year ago. The problem is uncontrolled. Recent lipid tests were reviewed and are normal. Exacerbating diseases include obesity. Pertinent negatives include no shortness of breath. Current antihyperlipidemic treatment includes statins. The current treatment provides moderate improvement of lipids. Risk factors for coronary  artery disease include diabetes mellitus, hypertension, post-menopausal, a sedentary lifestyle, dyslipidemia and obesity.  Diabetes She presents for her follow-up diabetic visit. She has type 2 diabetes mellitus. Hypoglycemia symptoms include nervousness/anxiousness. Associated symptoms include foot paresthesias. Pertinent negatives for diabetes include no blurred vision. Symptoms are stable. Diabetic complications include peripheral neuropathy. Risk factors for coronary artery disease include dyslipidemia, diabetes mellitus, hypertension, sedentary lifestyle and post-menopausal. She is following a generally unhealthy diet. Her overall blood glucose range is 140-180 mg/dl. Eye exam is current.  Anxiety Presents for follow-up visit. Symptoms include excessive worry and nervous/anxious behavior. Patient reports no shortness of breath or suicidal ideas. Symptoms occur occasionally. The severity of symptoms is mild.    Depression        This is a chronic problem.  The current episode started more than 1 year ago.   The problem occurs rarely.  Associated symptoms include decreased interest.  Associated symptoms include no helplessness, no hopelessness, not sad and no suicidal ideas.  Past treatments include SSRIs - Selective serotonin reuptake inhibitors.  Past medical history includes anxiety.   Back Pain This is a chronic problem. The current episode started more than 1 year ago. The problem occurs intermittently. The problem has been waxing and waning since onset. The pain is present in the gluteal and lumbar spine. The quality of the pain is described as aching. The pain is at a severity of 3/10. The pain is moderate. The symptoms are aggravated by bending and standing. Risk factors include obesity. She has tried NSAIDs for  the symptoms. The treatment provided mild relief.  Shoulder Pain  The pain is present in the right shoulder, right arm and right elbow. This is a new problem. The current episode  started 1 to 4 weeks ago. There has been no history of extremity trauma. The problem occurs constantly. The problem has been gradually worsening. The quality of the pain is described as aching. The pain is at a severity of 10/10. The pain is moderate. She has tried rest and NSAIDS for the symptoms. The treatment provided mild relief.      Review of Systems  Constitutional:  Negative for malaise/fatigue.  Eyes:  Negative for blurred vision.  Respiratory:  Negative for shortness of breath.   Gastrointestinal:  Positive for heartburn.  Musculoskeletal:  Positive for back pain.  Psychiatric/Behavioral:  Negative for suicidal ideas. The patient is nervous/anxious.   All other systems reviewed and are negative.  Family History  Problem Relation Age of Onset   Diabetes Mother    Cancer Mother        breast/lung   Heart attack Father    Hypertension Father    Diabetes Sister    Heart disease Sister    Cancer Maternal Grandmother        lung   Kidney disease Sister    Social History   Socioeconomic History   Marital status: Married    Spouse name: Not on file   Number of children: 3   Years of education: Not on file   Highest education level: GED or equivalent  Occupational History   Not on file  Tobacco Use   Smoking status: Never   Smokeless tobacco: Never  Vaping Use   Vaping status: Never Used  Substance and Sexual Activity   Alcohol use: Never   Drug use: Never   Sexual activity: Yes    Birth control/protection: None  Other Topics Concern   Not on file  Social History Narrative   Not on file   Social Drivers of Health   Financial Resource Strain: Low Risk  (07/28/2023)   Overall Financial Resource Strain (CARDIA)    Difficulty of Paying Living Expenses: Not hard at all  Food Insecurity: No Food Insecurity (07/28/2023)   Hunger Vital Sign    Worried About Running Out of Food in the Last Year: Never true    Ran Out of Food in the Last Year: Never true   Transportation Needs: No Transportation Needs (07/28/2023)   PRAPARE - Administrator, Civil Service (Medical): No    Lack of Transportation (Non-Medical): No  Physical Activity: Insufficiently Active (07/28/2023)   Exercise Vital Sign    Days of Exercise per Week: 1 day    Minutes of Exercise per Session: 10 min  Stress: No Stress Concern Present (07/28/2023)   [employer] of Occupational Health - Occupational Stress Questionnaire    Feeling of Stress : Not at all  Social Connections: Socially Integrated (07/28/2023)   Social Connection and Isolation Panel    Frequency of Communication with Friends and Family: More than three times a week    Frequency of Social Gatherings with Friends and Family: Three times a week    Attends Religious Services: More than 4 times per year    Active Member of Clubs or Organizations: Yes    Attends Banker Meetings: More than 4 times per year    Marital Status: Married       Objective:   Physical Exam Vitals reviewed.  Constitutional:      General: She is not in acute distress.    Appearance: She is well-developed. She is obese.  HENT:     Head: Normocephalic and atraumatic.     Right Ear: Tympanic membrane normal.     Left Ear: Tympanic membrane normal.   Eyes:     Pupils: Pupils are equal, round, and reactive to light.   Neck:     Thyroid: No thyromegaly.   Cardiovascular:     Rate and Rhythm: Normal rate and regular rhythm.     Heart sounds: Normal heart sounds. No murmur heard. Pulmonary:     Effort: Pulmonary effort is normal. No respiratory distress.     Breath sounds: Normal breath sounds. No wheezing.  Abdominal:     General: Bowel sounds are normal. There is no distension.     Palpations: Abdomen is soft.     Tenderness: There is no abdominal tenderness.   Musculoskeletal:        General: No tenderness.     Cervical back: Normal range of motion and neck supple.     Comments: Pain in right should  with abduction, full ROM   Skin:    General: Skin is warm and dry.   Neurological:     Mental Status: She is alert and oriented to person, place, and time.     Cranial Nerves: No cranial nerve deficit.     Deep Tendon Reflexes: Reflexes are normal and symmetric.   Psychiatric:        Behavior: Behavior normal.        Thought Content: Thought content normal.        Judgment: Judgment normal.       BP 118/73   Pulse 79   Temp 97.8 F (36.6 C) (Temporal)   Ht 5' 4 (1.626 m)   Wt (!) 307 lb (139.3 kg)   SpO2 93%   BMI 52.70 kg/m      Assessment & Plan:  CHARMIAN FORBIS comes in today with chief complaint of Shoulder Pain and Arm Pain (Started week and a half ago/Back of arm and elbow hurts most/Mentioned to chiropractor but no help/Tingles sometimes /Pain does not ease up)   Diagnosis and orders addressed:  1. Type 2 diabetes mellitus with other specified complication, without long-term current use of insulin  (HCC) (Primary) -Will increase Tresiba  to 40 units from 36 units Low carb - CMP14+EGFR - AMB Referral [facility] - insulin  degludec (TRESIBA  FLEXTOUCH) 100 UNIT/ML FlexTouch Pen; Inject 40 Units into the skin daily.  Dispense: 9 mL; Refill: 2  2. Gastroesophageal reflux disease without esophagitis - CMP14+EGFR  3. GAD (generalized anxiety disorder) - CMP14+EGFR  4. Hyperlipidemia associated with type 2 diabetes mellitus (HCC) - CMP14+EGFR - AMB Referral [facility]  5. Essential hypertension - CMP14+EGFR - AMB Referral [facility]  6. Depression, major, single episode, mild (HCC) - CMP14+EGFR  7. Degeneration of intervertebral disc of lumbar region, unspecified whether pain present - CMP14+EGFR  8. Cervical radiculopathy Baclofen  as needed Sedation precautions  - ketorolac  (TORADOL ) injection 60 mg - CMP14+EGFR - baclofen  (LIORESAL ) 10 MG tablet; Take 1 tablet (10 mg total) by mouth 3 (three) times daily.  Dispense:  60 each; Refill: 1    Labs pending Will increase Tresiba  to 40 units from 36, ok to increase 3 units every 3 days until glucose <180 in AM.  Referral to [facility] placed for diabetic education Continue current medications  Health Maintenance  reviewed Diet and exercise encouraged  Follow up plan: 2 months for uncontrolled DM   Bari Learn, FNP

## 2023-10-27 NOTE — Patient Instructions (Signed)

## 2023-10-27 NOTE — Telephone Encounter (Signed)
 Copied from CRM [id number]. Topic: Clinical - Prescription Issue >> Oct 27, 2023  2:49 PM Santiya F wrote: Reason for CRM: Patient is calling in because she says her pharmacy has faxed over refill requests for celecoxib  (CELEBREX ) 200 MG capsule, but the pharmacy hadn't heard back from the office. Reviewed and saw the medication was sent over on 10/16/23, pharmacy says they never received it. Patient is requesting it be resent to the pharmacy so she can pick up the medication.

## 2023-10-28 ENCOUNTER — Other Ambulatory Visit: Payer: Self-pay | Admitting: Family

## 2023-10-28 DIAGNOSIS — E1169 Type 2 diabetes mellitus with other specified complication: Secondary | ICD-10-CM

## 2023-10-28 LAB — CMP14+EGFR
ALT: 17 IU/L (ref 0–32)
AST: 14 IU/L (ref 0–40)
Albumin: 4.1 g/dL (ref 3.9–4.9)
Alkaline Phosphatase: 72 IU/L (ref 44–121)
BUN/Creatinine Ratio: 27 — ABNORMAL HIGH (ref 9–23)
BUN: 15 mg/dL (ref 6–24)
Bilirubin Total: 0.3 mg/dL (ref 0.0–1.2)
CO2: 22 mmol/L (ref 20–29)
Calcium: 9.3 mg/dL (ref 8.7–10.2)
Chloride: 101 mmol/L (ref 96–106)
Creatinine, Ser: 0.55 mg/dL — ABNORMAL LOW (ref 0.57–1.00)
Globulin, Total: 2.4 g/dL (ref 1.5–4.5)
Glucose: 178 mg/dL — ABNORMAL HIGH (ref 70–99)
Potassium: 5.1 mmol/L (ref 3.5–5.2)
Sodium: 138 mmol/L (ref 134–144)
Total Protein: 6.5 g/dL (ref 6.0–8.5)
eGFR: 114 mL/min/{1.73_m2} (ref >59)

## 2023-10-30 ENCOUNTER — Telehealth: Payer: Self-pay

## 2023-10-30 NOTE — Progress Notes (Signed)
 Care Guide Pharmacy Note  10/30/2023 Name: KRISTIANA JACKO MRN: [id number] DOB: [birth year]  Referred By: [name] LABOR, FNP Reason for referral: Complex Care Management (Outreach to schedule with Pharm d )   MAHEK SCHLESINGER is a 48 y.o. year old female who is a primary care patient of [name] LABOR, FNP.  ELAIN WIXON was referred to the pharmacist for assistance related to: HTN, HLD, and DMII  Successful contact was made with the patient to discuss pharmacy services including being ready for the pharmacist to call at least 5 minutes before the scheduled appointment time and to have medication bottles and any blood pressure readings ready for review. The patient agreed to meet with the pharmacist via telephone visit on (date/time).11/21/2023  Jeoffrey Buffalo , RMA     [place]  [facility], [facility] Guide  Direct Dial : [phone]  Website: [email or web address]

## 2023-10-31 ENCOUNTER — Ambulatory Visit: Payer: Self-pay | Admitting: Family

## 2023-11-01 ENCOUNTER — Other Ambulatory Visit: Payer: Self-pay | Admitting: Family

## 2023-11-01 DIAGNOSIS — E1169 Type 2 diabetes mellitus with other specified complication: Secondary | ICD-10-CM

## 2023-11-08 ENCOUNTER — Telehealth: Payer: Self-pay

## 2023-11-08 DIAGNOSIS — E1169 Type 2 diabetes mellitus with other specified complication: Secondary | ICD-10-CM

## 2023-11-08 NOTE — Telephone Encounter (Signed)
 Copied from CRM [id number]. Topic: Clinical - Medication Question >> Nov 08, 2023  2:21 PM [name] wrote: Reason for CRM: The patient would like to know if she can switch to [facility] sensor instead of libre//Please call to advise

## 2023-11-09 MED ORDER — DEXCOM G6 SENSOR MISC: 9 refills | Status: DC

## 2023-11-09 NOTE — Addendum Note (Signed)
 Addended by: LAVELL LYE A on: 11/09/2023 02:05 PM   Modules accepted: Orders

## 2023-11-09 NOTE — Telephone Encounter (Signed)
 Dexcom Prescription sent to pharmacy, unsure if insurance will pay.

## 2023-11-16 ENCOUNTER — Ambulatory Visit: Payer: Self-pay

## 2023-11-16 NOTE — Telephone Encounter (Signed)
 FYI Only or Action Required?: FYI only for provider.  Patient was last seen in primary care on 10/27/2023 by Glenda Mitchell LABOR, FNP.  Called Nurse Triage reporting Toe Injury.  Symptoms began several days ago.  Interventions attempted: Nothing.  Symptoms are: gradually worsening.  Triage Disposition: See Physician Within 24 Hours  Patient/caregiver understands and will follow disposition?: Yes  Copied from CRM [id number]. Topic: Clinical - Red Word Triage >> Nov 16, 2023  5:05 PM Tobias L wrote: Red Word that prompted transfer to Nurse Triage: toenail might be infected, purplish & swollen, patient diabetic Reason for Disposition  [1] Pus or cloudy fluid draining from wound AND [2] no fever  Answer Assessment - Initial Assessment Questions 1. MECHANISM: How did the injury happen?      Pedicure a while ago, then was at the beach this weekend, she soaked it in the salty water hoping that it would be helpful 2. ONSET: When did the injury happen? (e.g., minutes, hours ago)      Appearance of infection four days ago 3. LOCATION: What part of the toe is injured? Is the nail damaged?      Right foot big toe 4. APPEARANCE of TOE INJURY: What does the injury look like?      Big toe around toe nail is purple and swollen, with purulent and bloody and purulent drainage 5. SEVERITY: Can you use the foot normally? Can you walk?      No problems walking 7. PAIN: Is there pain? If Yes, ask: How bad is the pain?   What does it keep you from doing? (Scale 0-10; or none, mild, moderate, severe)     Denies pain 8. TETANUS: For any breaks in the skin, ask: When was your last tetanus booster?     Not sure 9. DIABETES: Do you have a history of diabetes or poor circulation in the feet?     Patient is diabetic 10. OTHER SYMPTOMS: Do you have any other symptoms?        denies  Answer Assessment - Initial Assessment Questions 1. LOCATION: Where is the wound located?      Right  big toe 2. WOUND APPEARANCE: What does the wound look like?      Purple around toenail, draining purulent and bloody drainage 3. SIZE: If redness is present, ask: What is the size of the red area? (Inches, centimeters, or compare to size of a coin)      Purple in color and swollen 4. SPREAD: What's changed in the last day?  Do you see any red streaks coming from the wound?     denies 5. ONSET: When did it start to look infected?      About four days ago 6. MECHANISM: How did the wound start, what was the cause?     Patient had a pedicure a couple of months ago and thinks that they cut too much.  Toe began to look infected about four days ago.  Patient soaked her toe at the beach over the weekend hoping that the salty water would help 7. PAIN: Do you have any pain?  If Yes, ask: How bad is the pain?  (e.g., Scale 1-10; mild, moderate, or severe)     Denies pain 8. FEVER: Do you have a fever? If Yes, ask: What is your temperature, how was it measured, and when did it start?     denies 9. OTHER SYMPTOMS: Do you have any other symptoms? (e.g., shaking chills, weakness,  rash elsewhere on body)     denies  Protocols used: Toe Injury-A-AH, Wound Infection Suspected-A-AH

## 2023-11-17 ENCOUNTER — Telehealth: Payer: Self-pay

## 2023-11-17 ENCOUNTER — Ambulatory Visit: Admitting: Family Medicine

## 2023-11-17 ENCOUNTER — Encounter: Payer: Self-pay | Admitting: Family Medicine

## 2023-11-17 VITALS — BP 115/79 | HR 86 | Ht 64.0 in | Wt 307.0 lb

## 2023-11-17 DIAGNOSIS — L600 Ingrowing nail: Secondary | ICD-10-CM

## 2023-11-17 MED ORDER — SULFAMETHOXAZOLE-TRIMETHOPRIM 800-160 MG PO TABS: 1.0000 | ORAL_TABLET | Freq: Two times a day (BID) | ORAL | 0 refills | Status: DC

## 2023-11-17 NOTE — Telephone Encounter (Signed)
 Copied from CRM [id number]. Topic: Clinical - Medication Prior Auth >> Nov 16, 2023  5:02 PM Tobias L wrote: Reason for CRM: Patient states she was told by pharmacy a prior authorization would be needed for Continuous Glucose Sensor (DEXCOM G6 SENSOR) MISC.   Patient requesting prior authorization to be completed.

## 2023-11-17 NOTE — Progress Notes (Signed)
 BP 115/79   Pulse 86   Ht 5' 4 (1.626 m)   Wt (!) 307 lb (139.3 kg)   SpO2 96%   BMI 52.70 kg/m    Subjective:   Patient ID: Glenda Mitchell, female    DOB: August 03, 1975, 48 y.o.   MRN: [id number]  HPI: Glenda Mitchell is a 48 y.o. female presenting on 11/17/2023 for Toe Pain (Right great toe)   HPI Patient is coming in today with complaints of ingrown and infected toenail on her right great toe on the medial side of it.  She said she went to a nail place and had a pedicure and does not know if this started after that but she did go to the beach and had done some Epsom salt soaks and it had improved, it was having a lot more purulent drainage but now it is having more clear drainage but it still is quite inflamed and irritated and red.  She said it is also very painful.  Relevant past medical, surgical, family and social history reviewed and updated as indicated. Interim medical history since our last visit reviewed. Allergies and medications reviewed and updated.  Review of Systems  Constitutional:  Negative for chills and fever.  Eyes:  Negative for visual disturbance.  Respiratory:  Negative for chest tightness and shortness of breath.   Cardiovascular:  Negative for chest pain and leg swelling.  Skin:  Positive for color change and rash.  Neurological:  Negative for light-headedness and headaches.  Psychiatric/Behavioral:  Negative for agitation and behavioral problems.   All other systems reviewed and are negative.   Per HPI unless specifically indicated above   Allergies as of 11/17/2023       Reactions   Clarithromycin Other (See Comments)   Chest pain and SOB   Ivp Dye [iodinated Contrast Media]    Lip swelling when receiving dye for procedure   Jardiance [empagliflozin]    Yeast infection        Medication List        Accurate as of November 17, 2023  3:10 PM. If you have any questions, ask your nurse or doctor.          allopurinol  100 MG  tablet Commonly known as: ZYLOPRIM  Take 2 tablets (200 mg total) by mouth daily.   atorvastatin  20 MG tablet Commonly known as: LIPITOR Take 1 tablet by mouth once daily   baclofen  10 MG tablet Commonly known as: LIORESAL  Take 1 tablet (10 mg total) by mouth 3 (three) times daily.   celecoxib  200 MG capsule Commonly known as: CELEBREX  Take 1 capsule (200 mg total) by mouth 2 (two) times daily.   cetirizine  10 MG tablet Commonly known as: ZYRTEC  Take 1 tablet by mouth once daily   escitalopram  10 MG tablet Commonly known as: LEXAPRO  Take 1 tablet by mouth once daily   fluticasone  50 MCG/ACT nasal spray Commonly known as: FLONASE  Place 2 sprays into both nostrils daily.   FreeStyle [employer] System Misc 1 Device by Does not apply route in the morning, at noon, in the evening, and at bedtime.   FreeStyle Libre 3 Sensor Misc Place 1 sensor on the skin every 14 days. Use to check glucose continuously   Dexcom G6 Sensor Misc Use to check Blood glucose up to 4 times daily   losartan  50 MG tablet Commonly known as: COZAAR  Take 1 tablet (50 mg total) by mouth daily.   metFORMIN  1000 MG tablet Commonly known  as: GLUCOPHAGE  Take 1 tablet (1,000 mg total) by mouth 2 (two) times daily.   Mounjaro  15 MG/0.5ML Pen Generic drug: tirzepatide  INJECT 1 SYRINGE SUBCUTANEOUSLY ONCE A WEEK   onetouch ultrasoft lancets Use as instructed   OneTouch Verio test strip Generic drug: glucose blood Test BS daily Dx E11.9   OneTouch Verio w/Device Kit 1 kit by Does not apply route 3 (three) times daily before meals.   pantoprazole  20 MG tablet Commonly known as: PROTONIX  Take 1 tablet by mouth twice daily   polyethylene glycol powder 17 GM/SCOOP powder Commonly known as: GLYCOLAX /MIRALAX  Take 17 g by mouth daily.   sulfamethoxazole-trimethoprim 800-160 MG tablet Commonly known as: BACTRIM DS Take 1 tablet by mouth 2 (two) times daily. Started by: Fonda LABOR Reyaan Thoma    Tresiba  FlexTouch 100 UNIT/ML FlexTouch Pen Generic drug: insulin  degludec Inject 40 Units into the skin daily.   VITAMIN D -3 PO Take 1,000 Units by mouth 3 (three) times a week.         Objective:   BP 115/79   Pulse 86   Ht 5' 4 (1.626 m)   Wt (!) 307 lb (139.3 kg)   SpO2 96%   BMI 52.70 kg/m   Wt Readings from Last 3 Encounters:  11/17/23 (!) 307 lb (139.3 kg)  10/27/23 (!) 307 lb (139.3 kg)  09/29/23 (!) 302 lb (137 kg)    Physical Exam Vitals and nursing note reviewed.  Musculoskeletal:       Feet:  Feet:     Right foot:     Skin integrity: Erythema and warmth present. No skin breakdown.     Toenail Condition: Right toenails are ingrown.       Assessment & Plan:   Problem List Items Addressed This Visit   None Visit Diagnoses       Ingrown toenail of right foot    -  Primary   Relevant Medications   sulfamethoxazole-trimethoprim (BACTRIM DS) 800-160 MG tablet       Sent Bactrim antibiotic and instructed her to do regular Epsom salt soaks.  Return if worsens or does not improve. Follow up plan: Return if symptoms worsen or fail to improve.  Counseling provided for all of the vaccine components No orders of the defined types were placed in this encounter.   Fonda Levins, MD [facility] 11/17/2023, 3:10 PM

## 2023-11-21 ENCOUNTER — Other Ambulatory Visit (HOSPITAL_COMMUNITY): Payer: Self-pay

## 2023-11-21 ENCOUNTER — Telehealth: Payer: Self-pay

## 2023-11-21 ENCOUNTER — Other Ambulatory Visit

## 2023-11-21 NOTE — Telephone Encounter (Signed)
 PA request has been Submitted. New Encounter has been or will be created for follow up. For additional info see Pharmacy Prior Auth telephone encounter from 11/21/23.

## 2023-11-21 NOTE — Telephone Encounter (Signed)
 Pharmacy Patient Advocate Encounter   Received notification from Pt Calls Messages that prior authorization for DEXCOM G6 SENSOR is required/requested.   Insurance verification completed.   The patient is insured through [facility] .   Per test claim: PA required; PA submitted to above mentioned insurance via Prompt PA Key/confirmation #/EOC [id number] Status is pending

## 2023-11-22 ENCOUNTER — Other Ambulatory Visit (HOSPITAL_COMMUNITY): Payer: Self-pay

## 2023-11-22 ENCOUNTER — Encounter: Payer: Self-pay | Admitting: Gastroenterology

## 2023-11-23 ENCOUNTER — Other Ambulatory Visit (HOSPITAL_COMMUNITY): Payer: Self-pay

## 2023-11-23 NOTE — Telephone Encounter (Signed)
 Pharmacy Patient Advocate Encounter  Received notification from [employer]EF[employer] that Prior Authorization for [facility] G6 SENSOR  has been APPROVED from 11/22/23 to 11/20/24. Unable to obtain price due to refill too soon rejection, last fill date 11/23/23 next available fill date10/7/25   PA #/Case ID/Reference #: [id number]

## 2023-12-06 ENCOUNTER — Telehealth: Payer: Self-pay | Admitting: Family

## 2023-12-06 NOTE — Telephone Encounter (Unsigned)
 Copied from CRM #[id number]. Topic: Clinical - Medication Question >> Dec 06, 2023  4:05 PM Elle L wrote: Reason for CRM: The patient states there is another part that she needs a prescription for in order to use her Continuous Glucose Sensor (DEXCOM G6 SENSOR) MISC but she cannot recall the name of it.  Pharmacy: [facility] [address] [place] - [address] [place], [place] [address] Phone: [phone]  Fax: [phone]

## 2023-12-07 ENCOUNTER — Other Ambulatory Visit (HOSPITAL_COMMUNITY): Payer: Self-pay

## 2023-12-07 ENCOUNTER — Telehealth: Payer: Self-pay

## 2023-12-07 MED ORDER — DEXCOM G6 TRANSMITTER MISC
0 refills | Status: DC
Start: 2023-12-07 — End: 2023-12-08

## 2023-12-07 NOTE — Telephone Encounter (Signed)
 Pharmacy Patient Advocate Encounter   Received notification from Onbase that prior authorization for Dexcom G6 transmitter is required/requested.   Insurance verification completed.   The patient is insured through [facility] .   Per test claim: PA required; PA submitted to above mentioned insurance via Prompt PA Key/confirmation #/EOC [id number] Status is pending

## 2023-12-07 NOTE — Telephone Encounter (Signed)
 Fax from [employer] Pt started on Dexcom G6 sensors, needing transmitter, script sent to pharmacy

## 2023-12-08 ENCOUNTER — Ambulatory Visit: Admitting: Family

## 2023-12-08 ENCOUNTER — Encounter: Payer: Self-pay | Admitting: Family

## 2023-12-08 ENCOUNTER — Telehealth: Payer: Self-pay | Admitting: Pharmacist

## 2023-12-08 VITALS — BP 120/80 | HR 70 | Temp 98.7°F | Ht 64.0 in | Wt 307.0 lb

## 2023-12-08 DIAGNOSIS — K219 Gastro-esophageal reflux disease without esophagitis: Secondary | ICD-10-CM | POA: Diagnosis not present

## 2023-12-08 DIAGNOSIS — F320 Major depressive disorder, single episode, mild: Secondary | ICD-10-CM

## 2023-12-08 DIAGNOSIS — E1169 Type 2 diabetes mellitus with other specified complication: Secondary | ICD-10-CM

## 2023-12-08 DIAGNOSIS — M1A9XX0 Chronic gout, unspecified, without tophus (tophi): Secondary | ICD-10-CM

## 2023-12-08 DIAGNOSIS — E559 Vitamin D deficiency, unspecified: Secondary | ICD-10-CM

## 2023-12-08 DIAGNOSIS — Z794 Long term (current) use of insulin: Secondary | ICD-10-CM

## 2023-12-08 DIAGNOSIS — E119 Type 2 diabetes mellitus without complications: Secondary | ICD-10-CM

## 2023-12-08 DIAGNOSIS — F411 Generalized anxiety disorder: Secondary | ICD-10-CM

## 2023-12-08 DIAGNOSIS — E785 Hyperlipidemia, unspecified: Secondary | ICD-10-CM

## 2023-12-08 DIAGNOSIS — I10 Essential (primary) hypertension: Secondary | ICD-10-CM

## 2023-12-08 LAB — BAYER DCA HB A1C WAIVED: HB A1C (BAYER DCA - WAIVED): 8.7 % — ABNORMAL HIGH (ref 4.8–5.6)

## 2023-12-08 MED ORDER — TRESIBA FLEXTOUCH 100 UNIT/ML ~~LOC~~ SOPN: 46.0000 [IU] | PEN_INJECTOR | Freq: Every day | SUBCUTANEOUS | 2 refills | Status: DC

## 2023-12-08 MED ORDER — DEXCOM G6 TRANSMITTER MISC: 3 refills | Status: AC

## 2023-12-08 NOTE — Telephone Encounter (Signed)
 Recommended to switch to DexcomG7, but patient has already picked up G6 sensors for 90 day supply Reported herlene was $40; dexcom g6 was $0 Still needs transmitter for G6 Awaiting PA--was submitted on separate PA platform per insurance  Follow up pharmD visit on 12/14/23  Mliss Tarry Griffin, PharmD, BCACP, CPP Clinical Pharmacist, [facility]

## 2023-12-08 NOTE — Progress Notes (Signed)
 Subjective:    Patient ID: Glenda Mitchell, female    DOB: 1975/05/11, 48 y.o.   MRN: [id number]  Chief Complaint  Patient presents with   Medical Management of Chronic Issues   PT presents the office today for chronic follow up.   She is followed by GYN annually for PCOS and pap.   She is taking Mounjaro  15 mg.      12/08/2023   11:28 AM 11/17/2023    3:03 PM 10/27/2023   12:05 PM  Last 3 Weights  Weight (lbs) 307 lb 307 lb 307 lb  Weight (kg) 139.254 kg 139.254 kg 139.254 kg    She has gout and taking allopurinol  100 mg. Last gout flare up was years ago.   Hypertension This is a chronic problem. The current episode started more than 1 year ago. The problem has been resolved since onset. The problem is controlled. Associated symptoms include anxiety. Pertinent negatives include no blurred vision, malaise/fatigue, peripheral edema or shortness of breath. Risk factors for coronary artery disease include dyslipidemia, diabetes mellitus, obesity and sedentary lifestyle. The current treatment provides moderate improvement.  Gastroesophageal Reflux She complains of belching and heartburn. This is a chronic problem. The current episode started more than 1 year ago. The problem occurs occasionally. The symptoms are aggravated by certain foods. Risk factors include obesity. She has tried a PPI and an antacid for the symptoms. The treatment provided moderate relief.  Hyperlipidemia This is a chronic problem. The current episode started more than 1 year ago. The problem is uncontrolled. Recent lipid tests were reviewed and are normal. Exacerbating diseases include obesity. Pertinent negatives include no shortness of breath. Current antihyperlipidemic treatment includes statins. The current treatment provides moderate improvement of lipids. Risk factors for coronary artery disease include diabetes mellitus, hypertension, post-menopausal, a sedentary lifestyle, dyslipidemia and obesity.   Diabetes She presents for her follow-up diabetic visit. She has type 2 diabetes mellitus. Hypoglycemia symptoms include nervousness/anxiousness. Associated symptoms include foot paresthesias. Pertinent negatives for diabetes include no blurred vision. Symptoms are stable. Diabetic complications include peripheral neuropathy. Risk factors for coronary artery disease include dyslipidemia, diabetes mellitus, hypertension, sedentary lifestyle and post-menopausal. She is following a generally unhealthy diet. Her overall blood glucose range is 140-180 mg/dl. Eye exam is current.  Anxiety Presents for follow-up visit. Symptoms include excessive worry and nervous/anxious behavior. Patient reports no shortness of breath or suicidal ideas. Symptoms occur occasionally. The severity of symptoms is mild.    Depression        This is a chronic problem.  The current episode started more than 1 year ago.   The problem occurs rarely.  Associated symptoms include decreased interest.  Associated symptoms include no helplessness, no hopelessness, not sad and no suicidal ideas.  Past treatments include SSRIs - Selective serotonin reuptake inhibitors.  Past medical history includes anxiety.   Back Pain This is a chronic problem. The current episode started more than 1 year ago. The problem occurs intermittently. The problem has been waxing and waning since onset. The pain is present in the gluteal and lumbar spine. The quality of the pain is described as aching. The pain is at a severity of 2/10 (when standing 3). The pain is moderate. The symptoms are aggravated by bending and standing. Risk factors include obesity. She has tried NSAIDs for the symptoms. The treatment provided mild relief.      Review of Systems  Constitutional:  Negative for malaise/fatigue.  Eyes:  Negative for blurred vision.  Respiratory:  Negative for shortness of breath.   Gastrointestinal:  Positive for heartburn.  Musculoskeletal:  Positive  for back pain.  Psychiatric/Behavioral:  Negative for suicidal ideas. The patient is nervous/anxious.   All other systems reviewed and are negative.  Family History  Problem Relation Age of Onset   Diabetes Mother    Cancer Mother        breast/lung   Heart attack Father    Hypertension Father    Diabetes Sister    Heart disease Sister    Cancer Maternal Grandmother        lung   Kidney disease Sister    Social History   Socioeconomic History   Marital status: Married    Spouse name: Not on file   Number of children: 3   Years of education: Not on file   Highest education level: GED or equivalent  Occupational History   Not on file  Tobacco Use   Smoking status: Never   Smokeless tobacco: Never  Vaping Use   Vaping status: Never Used  Substance and Sexual Activity   Alcohol use: Never   Drug use: Never   Sexual activity: Yes    Birth control/protection: None  Other Topics Concern   Not on file  Social History Narrative   Not on file   Social Drivers of Health   Financial Resource Strain: Low Risk  (07/28/2023)   Overall Financial Resource Strain (CARDIA)    Difficulty of Paying Living Expenses: Not hard at all  Food Insecurity: No Food Insecurity (12/08/2023)   Hunger Vital Sign    Worried About Running Out of Food in the Last Year: Never true    Ran Out of Food in the Last Year: Never true  Transportation Needs: No Transportation Needs (12/08/2023)   PRAPARE - Administrator, Civil Service (Medical): No    Lack of Transportation (Non-Medical): No  Physical Activity: Insufficiently Active (07/28/2023)   Exercise Vital Sign    Days of Exercise per Week: 1 day    Minutes of Exercise per Session: 10 min  Stress: No Stress Concern Present (07/28/2023)   [employer] of Occupational Health - Occupational Stress Questionnaire    Feeling of Stress : Not at all  Social Connections: Socially Integrated (07/28/2023)   Social Connection and Isolation Panel     Frequency of Communication with Friends and Family: More than three times a week    Frequency of Social Gatherings with Friends and Family: Three times a week    Attends Religious Services: More than 4 times per year    Active Member of Clubs or Organizations: Yes    Attends Banker Meetings: More than 4 times per year    Marital Status: Married       Objective:   Physical Exam Vitals reviewed.  Constitutional:      General: She is not in acute distress.    Appearance: She is well-developed. She is obese.  HENT:     Head: Normocephalic and atraumatic.     Right Ear: Tympanic membrane normal.     Left Ear: Tympanic membrane normal.  Eyes:     Pupils: Pupils are equal, round, and reactive to light.  Neck:     Thyroid: No thyromegaly.  Cardiovascular:     Rate and Rhythm: Normal rate and regular rhythm.     Heart sounds: Normal heart sounds. No murmur heard. Pulmonary:     Effort: Pulmonary effort is normal. No  respiratory distress.     Breath sounds: Normal breath sounds. No wheezing.  Abdominal:     General: Bowel sounds are normal. There is no distension.     Palpations: Abdomen is soft.     Tenderness: There is no abdominal tenderness.  Musculoskeletal:        General: No tenderness. Normal range of motion.     Cervical back: Normal range of motion and neck supple.  Skin:    General: Skin is warm and dry.  Neurological:     Mental Status: She is alert and oriented to person, place, and time.     Cranial Nerves: No cranial nerve deficit.     Deep Tendon Reflexes: Reflexes are normal and symmetric.  Psychiatric:        Behavior: Behavior normal.        Thought Content: Thought content normal.        Judgment: Judgment normal.       BP 120/80   Pulse 70   Temp 98.7 F (37.1 C)   Ht 5' 4 (1.626 m)   Wt (!) 307 lb (139.3 kg)   SpO2 94%   BMI 52.70 kg/m      Assessment & Plan:  ANTOINE FIALLOS comes in today with chief complaint of Medical  Management of Chronic Issues   Diagnosis and orders addressed:  1. Type 2 diabetes mellitus with other specified complication, without long-term current use of insulin  (HCC) (Primary) - insulin  degludec (TRESIBA  FLEXTOUCH) 100 UNIT/ML FlexTouch Pen; Inject 46 Units into the skin daily.  Dispense: 15 mL; Refill: 2 - Bayer DCA Hb A1c Waived - CMP14+EGFR  2. Gastroesophageal reflux disease without esophagitis - CMP14+EGFR  3. Vitamin D  deficiency - CMP14+EGFR  4. GAD (generalized anxiety disorder) - CMP14+EGFR  5. Hyperlipidemia associated with type 2 diabetes mellitus (HCC) - CMP14+EGFR  6. Essential hypertension - CMP14+EGFR  7. Depression, major, single episode, mild (HCC) - CMP14+EGFR  8. Chronic gout without tophus, unspecified cause, unspecified site - CMP14+EGFR   Labs pending A1C pending, continue Tresiba   46 units at this time ok to increase 3 units every 3 days until glucose <180 in AM.  Referral pending to Julie  for diabetic education Continue current medications  Health Maintenance reviewed Diet and exercise encouraged  Follow up plan: 2 months for uncontrolled DM   Bari Learn, FNP

## 2023-12-08 NOTE — Patient Instructions (Signed)

## 2023-12-09 LAB — CMP14+EGFR
ALT: 18 IU/L (ref 0–32)
AST: 17 IU/L (ref 0–40)
Albumin: 4.3 g/dL (ref 3.9–4.9)
Alkaline Phosphatase: 84 IU/L (ref 44–121)
BUN/Creatinine Ratio: 31 — ABNORMAL HIGH (ref 9–23)
BUN: 17 mg/dL (ref 6–24)
Bilirubin Total: 0.4 mg/dL (ref 0.0–1.2)
CO2: 24 mmol/L (ref 20–29)
Calcium: 9.7 mg/dL (ref 8.7–10.2)
Chloride: 100 mmol/L (ref 96–106)
Creatinine, Ser: 0.54 mg/dL — ABNORMAL LOW (ref 0.57–1.00)
Globulin, Total: 2.6 g/dL (ref 1.5–4.5)
Glucose: 178 mg/dL — ABNORMAL HIGH (ref 70–99)
Potassium: 4.7 mmol/L (ref 3.5–5.2)
Sodium: 140 mmol/L (ref 134–144)
Total Protein: 6.9 g/dL (ref 6.0–8.5)
eGFR: 115 mL/min/1.73 (ref >59)

## 2023-12-11 ENCOUNTER — Other Ambulatory Visit: Payer: Self-pay | Admitting: Family

## 2023-12-11 ENCOUNTER — Ambulatory Visit: Payer: Self-pay | Admitting: Family

## 2023-12-11 ENCOUNTER — Other Ambulatory Visit (HOSPITAL_COMMUNITY): Payer: Self-pay

## 2023-12-11 ENCOUNTER — Encounter: Payer: Self-pay | Admitting: Family

## 2023-12-11 DIAGNOSIS — E1169 Type 2 diabetes mellitus with other specified complication: Secondary | ICD-10-CM

## 2023-12-11 MED ORDER — TRESIBA FLEXTOUCH 100 UNIT/ML ~~LOC~~ SOPN: 50.0000 [IU] | PEN_INJECTOR | Freq: Every day | SUBCUTANEOUS | 2 refills | Status: DC

## 2023-12-11 NOTE — Telephone Encounter (Signed)
 Pharmacy Patient Advocate Encounter  Received notification from [employer]EFIT that Prior Authorization for Dexcom G6 transmitter  has been APPROVED from 12/07/23 to 12/05/24. Unable to obtain price due to refill too soon rejection, last fill date 12/10/23 next available fill date9/17/25   PA #/Case ID/Reference #: [id number]

## 2023-12-14 ENCOUNTER — Other Ambulatory Visit (INDEPENDENT_AMBULATORY_CARE_PROVIDER_SITE_OTHER): Admitting: Pharmacist

## 2023-12-14 ENCOUNTER — Telehealth: Payer: Self-pay

## 2023-12-14 DIAGNOSIS — Z794 Long term (current) use of insulin: Secondary | ICD-10-CM

## 2023-12-14 DIAGNOSIS — Z7985 Long-term (current) use of injectable non-insulin antidiabetic drugs: Secondary | ICD-10-CM

## 2023-12-14 DIAGNOSIS — E119 Type 2 diabetes mellitus without complications: Secondary | ICD-10-CM

## 2023-12-14 MED ORDER — TRESIBA FLEXTOUCH 200 UNIT/ML ~~LOC~~ SOPN: PEN_INJECTOR | SUBCUTANEOUS | 5 refills | Status: DC

## 2023-12-14 NOTE — Telephone Encounter (Signed)
 In preparing patient chart for upcoming colonoscopy which is currently scheduled at [facility], RN observed that patient BMI is >50, which is above the maximum for procedures at [facility]. Patient will need to have procedure at hospital. RN attempted to contact patient to inform her of this change and let her know that she will be contacted about rescheduling; left vm.  RN sent message to Dr. Hassan RN to arrange for this change.

## 2023-12-14 NOTE — Progress Notes (Signed)
 12/14/2023 Name: Glenda Mitchell MRN: [id number] DOB: 03-13-1976  Chief Complaint  Patient presents with   Diabetes    Glenda Mitchell is a 48 y.o. year old female who presented for a telephone visit.   They were referred to the pharmacist by their PCP for assistance in managing diabetes and medication access.   Subjective:  Patient is encouraged to continue bringing down A1c.  She does report she missed ocassional insulin  dose (not very often).  She will be starting on Dexcom G6 CGM that was called in for her.  Her herlene would not stay on and she states she wanted to try a different CGM system.  Care Team: Primary Care Provider: [name] LABOR, FNP ; Next Scheduled Visit: 02/08/24   Medication Access/Adherence  Current Pharmacy:  [id number] - [place], [place] - [address]  [place] [address] Phone: [phone] Fax: [phone]  Patient reports affordability concerns with their medications: No  Patient reports access/transportation concerns to their pharmacy: No  Patient reports adherence concerns with their medications:  No    Diabetes:  Current medications: recently increased Tresiba  to 50 units, metformin , Mounjaro  15 mg weekly Medications tried in the past:  Lantus , toujeo  Ozempic  (GI) Januvia  (no longer effective)  Current glucose readings: FBG 165, 190 Dexcom g6 was called in so patient got 90 day supply--encourage to use then we can see about getting Dexcom G7 Used libre 3 in the past, but reports it didn't stay on  Patient denies hypoglycemic s/sx including dizziness, shakiness, sweating. Patient denies hyperglycemic symptoms including polyuria, polydipsia, polyphagia, nocturia, neuropathy, blurred vision.  Current meal patterns:  Discussed meal planning options and Plate method for healthy eating Avoid sugary drinks and desserts Incorporate balanced protein, non starchy veggies, 1 serving of carbohydrate with each  meal Increase water intake Increase physical activity as able  Current physical activity: active with kids; often tired  Current medication access support: BCBS  Objective:  Lab Results  Component Value Date   HGBA1C 8.7 (H) 12/08/2023    Lab Results  Component Value Date   CREATININE 0.54 (L) 12/08/2023   BUN 17 12/08/2023   NA 140 12/08/2023   K 4.7 12/08/2023   CL 100 12/08/2023   CO2 24 12/08/2023    Lab Results  Component Value Date   CHOL 160 09/29/2023   HDL 38 (L) 09/29/2023   LDLCALC 78 09/29/2023   TRIG 270 (H) 09/29/2023   CHOLHDL 4.2 09/29/2023    Medications Reviewed Today     Reviewed by Billee Mliss BIRCH, [facility] (Pharmacist) on 12/14/23 at 1142  Med List Status: <None>   Medication Order Taking? Sig Documenting Provider Last Dose Status Informant  allopurinol  (ZYLOPRIM ) 100 MG tablet [id number]  Take 2 tablets (200 mg total) by mouth daily. [name] LABOR, FNP  Active   atorvastatin  (LIPITOR) 20 MG tablet [id number]  Take 1 tablet by mouth once daily [name] A, FNP  Active   baclofen  (LIORESAL ) 10 MG tablet [id number]  Take 1 tablet (10 mg total) by mouth 3 (three) times daily. [name] A, FNP  Active   Blood Glucose Monitoring Suppl (ONETOUCH VERIO) w/Device KIT [id number]  1 kit by Does not apply route 3 (three) times daily before meals. [name] A, FNP  Active   celecoxib  (CELEBREX ) 200 MG capsule [id number]  Take 1 capsule (200 mg total) by mouth 2 (two) times daily. [name] LABOR, FNP  Active   cetirizine  (  ZYRTEC ) 10 MG tablet [id number]  Take 1 tablet by mouth once daily Lavell Lye A, FNP  Active   Cholecalciferol (VITAMIN D -3 PO) [id number]  Take 1,000 Units by mouth 3 (three) times a week. [provider]  Active     Discontinued 12/14/23 1025   Continuous Glucose Transmitter (DEXCOM G6 TRANSMITTER) MISC [id number]  Use to check Blood glucose up to 4 times daily Dx E11.9 Lavell Lye A, FNP  Active   escitalopram   (LEXAPRO ) 10 MG tablet [id number]  Take 1 tablet by mouth once daily Lavell Lye A, FNP  Active   fluticasone  (FLONASE ) 50 MCG/ACT nasal spray [id number]  Place 2 sprays into both nostrils daily. Lavell Lye LABOR, FNP  Active   glucose blood [facility] VERIO) test strip [id number]  Test BS daily Dx E11.9 Lavell Lye LABOR, FNP  Active     Discontinued 12/14/23 1030   Lancets (ONETOUCH ULTRASOFT) lancets [id number]  Use as instructed Joshua Clayborne RAMAN, PA-C  Active   losartan  (COZAAR ) 50 MG tablet [id number]  Take 1 tablet (50 mg total) by mouth daily. Lavell Lye A, FNP  Active   metFORMIN  (GLUCOPHAGE ) 1000 MG tablet [id number]  Take 1 tablet (1,000 mg total) by mouth 2 (two) times daily. Lavell Lye LABOR, FNP  Active   MOUNJARO  15 MG/0.5ML Pen [id number]  INJECT 1 SYRINGE SUBCUTANEOUSLY ONCE A WEEK Hawks, Christy A, FNP  Active   pantoprazole  (PROTONIX ) 20 MG tablet [id number]  Take 1 tablet by mouth twice daily Hawks, [place] A, FNP  Active   polyethylene glycol powder (GLYCOLAX /MIRALAX ) 17 GM/SCOOP powder [id number]  Take 17 g by mouth daily. Lavell Lye A, FNP  Active   sulfamethoxazole -trimethoprim  (BACTRIM  DS) 800-160 MG tablet [id number]  Take 1 tablet by mouth 2 (two) times daily. Dettinger, Fonda LABOR, MD  Active             Assessment/Plan:   Diabetes: - Currently uncontrolled - Reviewed long term cardiovascular and renal outcomes of uncontrolled blood sugar - Reviewed goal A1c, goal fasting, and goal 2 hour post prandial glucose - Recommend to: Increase Tresiba  to 52 units daily (okay to increase by 2 units every 5 days until fasting blood sugar is <150) will transition patient to U200 once U100 supply used up Continue Mounjaro  15mg   weekly Consider meal time insulin  if not controlled/at goal at follow up (pending CGM data) - Patient denies personal or family history of multiple endocrine neoplasia type 2, medullary thyroid cancer; personal history of pancreatitis or gallbladder  disease. - Recommend to check glucose using Dexcom G6 CGM -on statin/ARB (compliant)--LDL 78 OK   Follow Up Plan: PCP in October 2025   Mliss Tarry Griffin, PharmD, BCACP, CPP Clinical Pharmacist, [facility]

## 2023-12-15 ENCOUNTER — Other Ambulatory Visit: Payer: Self-pay | Admitting: *Deleted

## 2023-12-15 DIAGNOSIS — Z01818 Encounter for other preprocedural examination: Secondary | ICD-10-CM

## 2023-12-15 DIAGNOSIS — Z1211 Encounter for screening for malignant neoplasm of colon: Secondary | ICD-10-CM

## 2023-12-15 NOTE — Telephone Encounter (Signed)
 Patient has been scheduled for colonoscopy at [facility] on 02/13/24 at 9:30 am, 8 am arrival. Please advise patient and discuss instructions at previsit.

## 2023-12-18 ENCOUNTER — Other Ambulatory Visit (HOSPITAL_COMMUNITY): Payer: Self-pay

## 2023-12-19 ENCOUNTER — Other Ambulatory Visit: Payer: Self-pay | Admitting: Family

## 2023-12-19 DIAGNOSIS — E119 Type 2 diabetes mellitus without complications: Secondary | ICD-10-CM

## 2023-12-19 NOTE — Telephone Encounter (Signed)
 Insulin  Degludec FlexTouch 200 UNIT/ML SOPN  Pharmacy comment: prior authorization is needed,,, insurance is saying it prefers lantus  or insulin  glargine.   Please advise if you want to make change or you want to do a PA on Tresiba 

## 2023-12-19 NOTE — Telephone Encounter (Signed)
 RN attempted to call patient to let her know that her colonoscopy has been rescheduled with Dr. Leigh at [facility] on 02/01/24 at 9:30 am. RN had to leave vm for patient.   RN also asked patient to call us  back to schedule her pre-visit appointment approximately 2 weeks prior to her scheduled procedure so that we can review all of the details needed to prepare for her procedure. RN left [facility] phone number for patient to call back.

## 2023-12-26 ENCOUNTER — Telehealth: Payer: Self-pay

## 2023-12-26 ENCOUNTER — Other Ambulatory Visit (HOSPITAL_COMMUNITY): Payer: Self-pay

## 2023-12-26 NOTE — Telephone Encounter (Signed)
 Pharmacy Patient Advocate Encounter   Received notification from Pt Calls Messages that prior authorization for TRESIBA  is required/requested.   Insurance verification completed.   The patient is insured through [facility] .   Per test claim: The current 30 day co-pay is, $0.  No PA needed at this time. This test claim was processed through [facility]- copay amounts may vary at other pharmacies due to pharmacy/plan contracts, or as the patient moves through the different stages of their insurance plan.     Called pharmacy to run prescription for Brand name. Per technician, refill is too soon and they will fill it when it is due.

## 2023-12-26 NOTE — Telephone Encounter (Signed)
 Insurance covers brand name Tresiba . Called pharmacy to run prescription for Brand name. Per technician, refill is too soon and they will fill it when it is due.

## 2023-12-29 ENCOUNTER — Encounter: Payer: Self-pay | Admitting: Obstetrics

## 2023-12-29 ENCOUNTER — Encounter

## 2023-12-29 LAB — HM MAMMOGRAPHY

## 2024-01-05 ENCOUNTER — Telehealth: Payer: Self-pay | Admitting: Family

## 2024-01-05 DIAGNOSIS — E119 Type 2 diabetes mellitus without complications: Secondary | ICD-10-CM

## 2024-01-05 MED ORDER — TRESIBA FLEXTOUCH 200 UNIT/ML ~~LOC~~ SOPN: PEN_INJECTOR | SUBCUTANEOUS | 5 refills | Status: DC

## 2024-01-05 NOTE — Telephone Encounter (Signed)
 Copied from CRM [id number]. Topic: Clinical - Medication Question >> Jan 05, 2024  2:02 PM Lauren C wrote: Reason for CRM: Pt went to pick up insulin  degludec (TRESIBA  FLEXTOUCH) 200 UNIT/ML FlexTouch Pen at the pharmacy and she says she received 100 instead. She is wanting to know if she needs to be taking a different amount of the medication. Please call pt to confirm instructions on how much to take ph#[phone]

## 2024-01-05 NOTE — Telephone Encounter (Signed)
 Called and spoke with patient.

## 2024-01-05 NOTE — Telephone Encounter (Signed)
 Please confirm Tresiba  dose. Mliss has a note for 200. Just need clarification from provider. The 100 Tresiba  is on the med list.

## 2024-01-05 NOTE — Telephone Encounter (Signed)
 IT should be the Tresbia 200 unit pen, but give herself up to 60 units a day.

## 2024-01-10 ENCOUNTER — Other Ambulatory Visit: Payer: Self-pay | Admitting: Family

## 2024-01-10 ENCOUNTER — Telehealth: Payer: Self-pay | Admitting: Gastroenterology

## 2024-01-10 DIAGNOSIS — H6992 Unspecified Eustachian tube disorder, left ear: Secondary | ICD-10-CM

## 2024-01-10 NOTE — Telephone Encounter (Signed)
 Returned patient call .  She wanted to clarify date at hospital.  Her husband may not be able to take her the day currently scheduled. Needs a Friday appointment if at all possible.  Nobody can take her, husband can only do Fridays. Reaching out to office RN to see if there are other potential hospital appointments available. Also scheduled a telephone PV for patient on 01/19/24

## 2024-01-10 NOTE — Telephone Encounter (Signed)
 Inbound call from patient requesting a call back to discuss procedure at hospital that is scheduled for 10/14. Please advise.

## 2024-01-11 NOTE — Telephone Encounter (Signed)
 Reached out to patient and offered the 11/20 hospital appointment.  Patient stated she will keep her current appointment.

## 2024-01-11 NOTE — Telephone Encounter (Signed)
 Unfortunately, we have NO hospital availabilities on Fridays for the foreseeable future and probably will not get any as Friday appointments have never really been an option for these hospital cases. We are very limited as to the times the hospital allows each physician time to complete procedures in the hospital setting. The only other date available to change her procedure to at this time would be for Thursday, 03/21/24.

## 2024-01-12 ENCOUNTER — Encounter: Admitting: Gastroenterology

## 2024-01-17 ENCOUNTER — Telehealth: Payer: Self-pay | Admitting: *Deleted

## 2024-01-17 NOTE — Telephone Encounter (Signed)
 Yesi,  This pt's BMI is greater than 50; their procedure will need to be performed at the hospital.  Thanks,  Cathlyn Parsons

## 2024-01-17 NOTE — Telephone Encounter (Signed)
 Glenda Mitchell, See message below, patient has a previsit in your room on 9/19 and her procedure is already scheduled at the hospital.

## 2024-01-19 ENCOUNTER — Ambulatory Visit (AMBULATORY_SURGERY_CENTER)

## 2024-01-19 VITALS — Ht 64.0 in | Wt 305.0 lb

## 2024-01-19 DIAGNOSIS — Z1211 Encounter for screening for malignant neoplasm of colon: Secondary | ICD-10-CM

## 2024-01-19 MED ORDER — ONDANSETRON HCL 4 MG PO TABS: 4.0000 mg | ORAL_TABLET | Freq: Three times a day (TID) | ORAL | 1 refills | Status: AC | PRN

## 2024-01-19 MED ORDER — NA SULFATE-K SULFATE-MG SULF 17.5-3.13-1.6 GM/177ML PO SOLN: 1.0000 | Freq: Once | ORAL | 0 refills | Status: AC

## 2024-01-19 NOTE — Progress Notes (Signed)
 No issues known to pt with past sedation with any surgeries or procedures Patient denies ever being told they had issues or difficulty with intubation  No FH of Malignant Hyperthermia Pt is not on diet pills Pt is not on home 02  Pt is not on blood thinners  Pt denies issues with chronic constipation  No A fib or A flutter Have any cardiac testing pending--no Pt instructed to use Singlecare.com or GoodRx for a price reduction on prep  Ambulates independently

## 2024-01-26 ENCOUNTER — Other Ambulatory Visit: Payer: Self-pay | Admitting: Family

## 2024-01-26 DIAGNOSIS — E1169 Type 2 diabetes mellitus with other specified complication: Secondary | ICD-10-CM

## 2024-01-29 ENCOUNTER — Other Ambulatory Visit: Payer: Self-pay | Admitting: Family

## 2024-01-29 DIAGNOSIS — E1169 Type 2 diabetes mellitus with other specified complication: Secondary | ICD-10-CM

## 2024-02-02 ENCOUNTER — Other Ambulatory Visit: Payer: Self-pay | Admitting: Family

## 2024-02-02 DIAGNOSIS — H6992 Unspecified Eustachian tube disorder, left ear: Secondary | ICD-10-CM

## 2024-02-06 ENCOUNTER — Encounter: Payer: Self-pay | Admitting: Gastroenterology

## 2024-02-06 ENCOUNTER — Telehealth: Payer: Self-pay

## 2024-02-06 NOTE — Telephone Encounter (Signed)
 Procedure:COLON Procedure date: 02/13/24 Procedure location: [facility] Arrival Time: 7:00 Spoke with the patient Y/N: Y Any prep concerns? N  Has the patient obtained the prep from the pharmacy ? Y Do you have a care partner and transportation: Y Any additional concerns? N

## 2024-02-07 ENCOUNTER — Encounter (HOSPITAL_COMMUNITY): Payer: Self-pay | Admitting: Gastroenterology

## 2024-02-07 NOTE — Progress Notes (Signed)
 Attempted to obtain medical history for pre op call via telephone, unable to reach at this time. HIPAA compliant voicemail message left requesting return call to pre surgical testing department.

## 2024-02-08 ENCOUNTER — Encounter: Payer: Self-pay | Admitting: Family

## 2024-02-08 ENCOUNTER — Ambulatory Visit: Admitting: Family

## 2024-02-08 VITALS — BP 108/79 | HR 87 | Temp 97.7°F | Ht 64.0 in | Wt 296.0 lb

## 2024-02-08 DIAGNOSIS — E559 Vitamin D deficiency, unspecified: Secondary | ICD-10-CM

## 2024-02-08 DIAGNOSIS — I10 Essential (primary) hypertension: Secondary | ICD-10-CM

## 2024-02-08 DIAGNOSIS — M51369 Other intervertebral disc degeneration, lumbar region without mention of lumbar back pain or lower extremity pain: Secondary | ICD-10-CM

## 2024-02-08 DIAGNOSIS — E1169 Type 2 diabetes mellitus with other specified complication: Secondary | ICD-10-CM

## 2024-02-08 DIAGNOSIS — M51362 Other intervertebral disc degeneration, lumbar region with discogenic back pain and lower extremity pain: Secondary | ICD-10-CM

## 2024-02-08 DIAGNOSIS — M4316 Spondylolisthesis, lumbar region: Secondary | ICD-10-CM

## 2024-02-08 DIAGNOSIS — F320 Major depressive disorder, single episode, mild: Secondary | ICD-10-CM | POA: Diagnosis not present

## 2024-02-08 DIAGNOSIS — Z794 Long term (current) use of insulin: Secondary | ICD-10-CM

## 2024-02-08 DIAGNOSIS — R5383 Other fatigue: Secondary | ICD-10-CM

## 2024-02-08 DIAGNOSIS — K219 Gastro-esophageal reflux disease without esophagitis: Secondary | ICD-10-CM

## 2024-02-08 DIAGNOSIS — F411 Generalized anxiety disorder: Secondary | ICD-10-CM

## 2024-02-08 DIAGNOSIS — Z1159 Encounter for screening for other viral diseases: Secondary | ICD-10-CM

## 2024-02-08 DIAGNOSIS — E119 Type 2 diabetes mellitus without complications: Secondary | ICD-10-CM

## 2024-02-08 LAB — BAYER DCA HB A1C WAIVED: HB A1C (BAYER DCA - WAIVED): 7.9 % — ABNORMAL HIGH (ref 4.8–5.6)

## 2024-02-08 MED ORDER — ATORVASTATIN CALCIUM 20 MG PO TABS: 20.0000 mg | ORAL_TABLET | Freq: Every day | ORAL | 1 refills | Status: DC

## 2024-02-08 MED ORDER — PANTOPRAZOLE SODIUM 20 MG PO TBEC: 20.0000 mg | DELAYED_RELEASE_TABLET | Freq: Two times a day (BID) | ORAL | 1 refills | Status: AC

## 2024-02-08 MED ORDER — LOSARTAN POTASSIUM 25 MG PO TABS: 25.0000 mg | ORAL_TABLET | Freq: Every day | ORAL | 1 refills | Status: AC

## 2024-02-08 MED ORDER — CELECOXIB 200 MG PO CAPS: 200.0000 mg | ORAL_CAPSULE | Freq: Two times a day (BID) | ORAL | 1 refills | Status: DC

## 2024-02-08 MED ORDER — MOUNJARO 15 MG/0.5ML ~~LOC~~ SOAJ: 15.0000 mg | SUBCUTANEOUS | 3 refills | Status: DC

## 2024-02-08 MED ORDER — ESCITALOPRAM OXALATE 10 MG PO TABS: 10.0000 mg | ORAL_TABLET | Freq: Two times a day (BID) | ORAL | 1 refills | Status: AC

## 2024-02-08 MED ORDER — METFORMIN HCL 1000 MG PO TABS: 1000.0000 mg | ORAL_TABLET | Freq: Two times a day (BID) | ORAL | 1 refills | Status: DC

## 2024-02-08 MED ORDER — TRESIBA FLEXTOUCH 200 UNIT/ML ~~LOC~~ SOPN
PEN_INJECTOR | SUBCUTANEOUS | 5 refills | Status: AC
Start: 2024-02-08 — End: ?

## 2024-02-08 NOTE — Progress Notes (Signed)
 Subjective:    Patient ID: Glenda Mitchell, female    DOB: 24-Aug-1975, 48 y.o.   MRN: [id number]  Chief Complaint  Patient presents with   Medical Management of Chronic Issues   PT presents the office today for chronic follow up.   She is followed by GYN annually for PCOS and pap.   She is taking Mounjaro  15 mg.      02/08/2024   10:37 AM 01/19/2024   11:15 AM 12/08/2023   11:28 AM  Last 3 Weights  Weight (lbs) 296 lb 305 lb 307 lb  Weight (kg) 134.265 kg 138.347 kg 139.254 kg    She has gout and taking allopurinol  100 mg. Last gout flare up was years ago.   Has chronic back pain and followed by chiropractor every 6 weeks.   Hypertension This is a chronic problem. The current episode started more than 1 year ago. The problem has been resolved since onset. The problem is controlled. Associated symptoms include anxiety and malaise/fatigue. Pertinent negatives include no blurred vision, peripheral edema or shortness of breath. Risk factors for coronary artery disease include dyslipidemia, diabetes mellitus, obesity and sedentary lifestyle. The current treatment provides moderate improvement.  Gastroesophageal Reflux She complains of belching and heartburn. This is a chronic problem. The current episode started more than 1 year ago. The problem occurs occasionally. The symptoms are aggravated by certain foods. Risk factors include obesity. She has tried a PPI and an antacid for the symptoms. The treatment provided moderate relief.  Hyperlipidemia This is a chronic problem. The current episode started more than 1 year ago. The problem is controlled. Recent lipid tests were reviewed and are normal. Exacerbating diseases include obesity. Pertinent negatives include no shortness of breath. Current antihyperlipidemic treatment includes statins. The current treatment provides moderate improvement of lipids. Risk factors for coronary artery disease include diabetes mellitus, hypertension,  post-menopausal, a sedentary lifestyle, dyslipidemia and obesity.  Diabetes She presents for her follow-up diabetic visit. She has type 2 diabetes mellitus. Hypoglycemia symptoms include nervousness/anxiousness. Associated symptoms include foot paresthesias. Pertinent negatives for diabetes include no blurred vision. Symptoms are stable. Diabetic complications include peripheral neuropathy. Risk factors for coronary artery disease include dyslipidemia, diabetes mellitus, hypertension, sedentary lifestyle and post-menopausal. She is following a generally unhealthy diet. Her overall blood glucose range is 130-140 mg/dl. Eye exam is current.  Anxiety Presents for follow-up visit. Symptoms include excessive worry and nervous/anxious behavior. Patient reports no shortness of breath or suicidal ideas. Symptoms occur occasionally. The severity of symptoms is mild.    Depression        This is a chronic problem.  The current episode started more than 1 year ago.   The problem occurs rarely.  Associated symptoms include decreased interest.  Associated symptoms include no helplessness, no hopelessness, not sad and no suicidal ideas.  Past treatments include SSRIs - Selective serotonin reuptake inhibitors.  Past medical history includes anxiety.   Back Pain This is a chronic problem. The current episode started more than 1 year ago. The problem occurs intermittently. The problem has been waxing and waning since onset. The pain is present in the gluteal and lumbar spine. The quality of the pain is described as aching. The pain is at a severity of 2/10 (when standing 3). The pain is moderate. The symptoms are aggravated by bending and standing. Risk factors include obesity. She has tried NSAIDs for the symptoms. The treatment provided mild relief.      Review of Systems  Constitutional:  Positive for malaise/fatigue.  Eyes:  Negative for blurred vision.  Respiratory:  Negative for shortness of breath.    Gastrointestinal:  Positive for heartburn.  Musculoskeletal:  Positive for back pain.  Psychiatric/Behavioral:  Negative for suicidal ideas. The patient is nervous/anxious.   All other systems reviewed and are negative.  Family History  Problem Relation Age of Onset   Diabetes Mother    Cancer Mother        breast/lung   Heart attack Father    Hypertension Father    Diabetes Sister    Heart disease Sister    Kidney disease Sister    Cancer Maternal Grandmother        lung   Lung cancer Maternal Grandmother    Colon cancer Neg Hx    Esophageal cancer Neg Hx    Rectal cancer Neg Hx    Stomach cancer Neg Hx    Colon polyps Neg Hx    Social History   Socioeconomic History   Marital status: Married    Spouse name: Not on file   Number of children: 3   Years of education: Not on file   Highest education level: GED or equivalent  Occupational History   Not on file  Tobacco Use   Smoking status: Never   Smokeless tobacco: Never  Vaping Use   Vaping status: Never Used  Substance and Sexual Activity   Alcohol use: Never   Drug use: Never   Sexual activity: Yes    Birth control/protection: None  Other Topics Concern   Not on file  Social History Narrative   Not on file   Social Drivers of Health   Financial Resource Strain: Low Risk  (07/28/2023)   Overall Financial Resource Strain (CARDIA)    Difficulty of Paying Living Expenses: Not hard at all  Food Insecurity: No Food Insecurity (12/08/2023)   Hunger Vital Sign    Worried About Running Out of Food in the Last Year: Never true    Ran Out of Food in the Last Year: Never true  Transportation Needs: No Transportation Needs (12/08/2023)   PRAPARE - Administrator, Civil Service (Medical): No    Lack of Transportation (Non-Medical): No  Physical Activity: Insufficiently Active (07/28/2023)   Exercise Vital Sign    Days of Exercise per Week: 1 day    Minutes of Exercise per Session: 10 min  Stress: No Stress  Concern Present (07/28/2023)   [employer] of Occupational Health - Occupational Stress Questionnaire    Feeling of Stress : Not at all  Social Connections: Socially Integrated (07/28/2023)   Social Connection and Isolation Panel    Frequency of Communication with Friends and Family: More than three times a week    Frequency of Social Gatherings with Friends and Family: Three times a week    Attends Religious Services: More than 4 times per year    Active Member of Clubs or Organizations: Yes    Attends Banker Meetings: More than 4 times per year    Marital Status: Married       Objective:   Physical Exam Vitals reviewed.  Constitutional:      General: She is not in acute distress.    Appearance: She is well-developed. She is obese.  HENT:     Head: Normocephalic and atraumatic.     Right Ear: Tympanic membrane normal.     Left Ear: Tympanic membrane normal.  Eyes:  Pupils: Pupils are equal, round, and reactive to light.  Neck:     Thyroid: No thyromegaly.  Cardiovascular:     Rate and Rhythm: Normal rate and regular rhythm.     Heart sounds: Normal heart sounds. No murmur heard. Pulmonary:     Effort: Pulmonary effort is normal. No respiratory distress.     Breath sounds: Normal breath sounds. No wheezing.  Abdominal:     General: Bowel sounds are normal. There is no distension.     Palpations: Abdomen is soft.     Tenderness: There is no abdominal tenderness.  Musculoskeletal:        General: No tenderness. Normal range of motion.     Cervical back: Normal range of motion and neck supple.  Skin:    General: Skin is warm and dry.  Neurological:     Mental Status: She is alert and oriented to person, place, and time.     Cranial Nerves: No cranial nerve deficit.     Deep Tendon Reflexes: Reflexes are normal and symmetric.  Psychiatric:        Behavior: Behavior normal.        Thought Content: Thought content normal.        Judgment: Judgment  normal.       BP 108/79   Pulse 87   Temp 97.7 F (36.5 C) (Temporal)   Ht 5' 4 (1.626 m)   Wt 296 lb (134.3 kg)   SpO2 96%   BMI 50.81 kg/m      Assessment & Plan:  YARIAH SELVEY comes in today with chief complaint of Medical Management of Chronic Issues   Diagnosis and orders addressed:  1. Type 2 diabetes mellitus with other specified complication, without long-term current use of insulin  (HCC) (Primary) - metFORMIN  (GLUCOPHAGE ) 1000 MG tablet; Take 1 tablet (1,000 mg total) by mouth 2 (two) times daily.  Dispense: 180 tablet; Refill: 1 - tirzepatide  (MOUNJARO ) 15 MG/0.5ML Pen; Inject 15 mg into the skin once a week.  Dispense: 4 mL; Refill: 3 - CMP14+EGFR - Bayer DCA Hb A1c Waived  2. Degeneration of intervertebral disc of lumbar region, unspecified whether pain present - CMP14+EGFR  3. Depression, major, single episode, mild - escitalopram  (LEXAPRO ) 10 MG tablet; Take 1 tablet (10 mg total) by mouth in the morning and at bedtime.  Dispense: 180 tablet; Refill: 1 - CMP14+EGFR  4. Essential hypertension - CMP14+EGFR  5. Hyperlipidemia associated with type 2 diabetes mellitus (HCC) - atorvastatin  (LIPITOR) 20 MG tablet; Take 1 tablet (20 mg total) by mouth daily.  Dispense: 90 tablet; Refill: 1 - CMP14+EGFR  6. GAD (generalized anxiety disorder)  - escitalopram  (LEXAPRO ) 10 MG tablet; Take 1 tablet (10 mg total) by mouth in the morning and at bedtime.  Dispense: 180 tablet; Refill: 1 - CMP14+EGFR  7. Gastroesophageal reflux disease without esophagitis - pantoprazole  (PROTONIX ) 20 MG tablet; Take 1 tablet (20 mg total) by mouth 2 (two) times daily.  Dispense: 180 tablet; Refill: 1 - CMP14+EGFR  8. Vitamin D  deficiency - CMP14+EGFR  9. Degenerative disc disease, lumbar  - celecoxib  (CELEBREX ) 200 MG capsule; Take 1 capsule (200 mg total) by mouth 2 (two) times daily.  Dispense: 180 capsule; Refill: 1 - CMP14+EGFR  10. Anterolisthesis of lumbar spine -  celecoxib  (CELEBREX ) 200 MG capsule; Take 1 capsule (200 mg total) by mouth 2 (two) times daily.  Dispense: 180 capsule; Refill: 1 - CMP14+EGFR  11. Insulin -requiring or dependent type II diabetes mellitus (HCC)  -  insulin  degludec (TRESIBA  FLEXTOUCH) 200 UNIT/ML FlexTouch Pen; Inject up to 60 units subcutaneously daily  Dispense: 9 mL; Refill: 5 - CMP14+EGFR  12. Other fatigue - Ambulatory referral to Sleep Studies - CMP14+EGFR  13. Morbid obesity (HCC) - Ambulatory referral to Sleep Studies - CMP14+EGFR  14. Need for hepatitis B screening test - CMP14+EGFR - Hepatitis B surface antibody,quantitative   Labs pending Will decrease losartan  to 25 mg from 50 mg  A1C pending, continue Tresiba   55-60 units at this time  Continue current medications  Health Maintenance reviewed- Encouraged to get eye exam Diet and exercise encouraged  Follow up plan: 3 months    Bari Learn, FNP

## 2024-02-08 NOTE — Patient Instructions (Signed)

## 2024-02-09 ENCOUNTER — Encounter: Payer: Self-pay | Admitting: Physician Assistant

## 2024-02-09 ENCOUNTER — Ambulatory Visit: Payer: Self-pay | Admitting: Family

## 2024-02-09 ENCOUNTER — Telehealth (HOSPITAL_COMMUNITY): Payer: Self-pay | Admitting: *Deleted

## 2024-02-09 LAB — CMP14+EGFR
ALT: 10 IU/L (ref 0–32)
AST: 11 IU/L (ref 0–40)
Albumin: 4.1 g/dL (ref 3.9–4.9)
Alkaline Phosphatase: 91 IU/L (ref 41–116)
BUN/Creatinine Ratio: 29 — ABNORMAL HIGH (ref 9–23)
BUN: 16 mg/dL (ref 6–24)
Bilirubin Total: 0.5 mg/dL (ref 0.0–1.2)
CO2: 25 mmol/L (ref 20–29)
Calcium: 9.7 mg/dL (ref 8.7–10.2)
Chloride: 99 mmol/L (ref 96–106)
Creatinine, Ser: 0.56 mg/dL — ABNORMAL LOW (ref 0.57–1.00)
Globulin, Total: 2.6 g/dL (ref 1.5–4.5)
Glucose: 148 mg/dL — ABNORMAL HIGH (ref 70–99)
Potassium: 4.7 mmol/L (ref 3.5–5.2)
Sodium: 139 mmol/L (ref 134–144)
Total Protein: 6.7 g/dL (ref 6.0–8.5)
eGFR: 114 mL/min/1.73 (ref >59)

## 2024-02-09 LAB — HEPATITIS B SURFACE ANTIBODY, QUANTITATIVE: Hepatitis B Surf Ab Quant: <3.5 m[IU]/mL — ABNORMAL LOW

## 2024-02-09 NOTE — Telephone Encounter (Signed)
 Got it, okay thanks for clarifying

## 2024-02-09 NOTE — Telephone Encounter (Signed)
 See 01/10/24 notes; looks like patient said her husband is only available on Fridays to bring her. She was told at that time that we have no Friday availabilities (and dont typically have Fridays anyways). Was offered 11/20 appointment but patient opted to keep 10/14 appointment instead.

## 2024-02-09 NOTE — Telephone Encounter (Signed)
 Okay thanks very much - I appreciate it, sounds like she is back on for 10/14

## 2024-02-09 NOTE — Telephone Encounter (Signed)
 This is quite frustrating. Dottie can you please contact them to see why they can't make it. She will not be allowed to direct another procedure and must be seen in the office to schedule any further procedures.   If anyone on the waiting list for procedures at the hospital is interested in getting them done next week please utilize the spots. Thanks

## 2024-02-09 NOTE — Telephone Encounter (Signed)
 I apologize. No she is not on for 10/14. Rather, she had previously called indicating that she wanted a different date because she would not be able to come on 10/14. When given the only other option available, she opted to keep the 10/14 appointment at that time but then cancelled that same 10/14 appointment today. She has instead scheduled an office visit on Tuesday, 04/02/24 with T.Garrett, PA-C

## 2024-02-09 NOTE — Telephone Encounter (Signed)
 Glenda Mitchell was scheduled for colonoscopy (Procedure) with Dr. Leigh  on 02/13/2024 (date), at [facility].   Patient/or family called on 02/09/2024 (date) to cancel their procedure due to not given (reason)  Dr leigh & office notified. Patient instructed to call physician's office to reschedule their procedure. Patient demonstrated understanding.

## 2024-02-09 NOTE — Telephone Encounter (Signed)
 Sorry - to clarify - did she say she is okay to do 10/14 then? If so she will need to be added back to the schedule. thanks

## 2024-02-13 ENCOUNTER — Encounter (HOSPITAL_COMMUNITY): Admission: RE | Payer: Self-pay | Source: Home / Self Care

## 2024-02-13 ENCOUNTER — Ambulatory Visit (HOSPITAL_COMMUNITY): Admission: RE | Admit: 2024-02-13 | Admitting: Gastroenterology

## 2024-02-13 SURGERY — COLONOSCOPY
Anesthesia: Monitor Anesthesia Care

## 2024-03-14 ENCOUNTER — Ambulatory Visit: Admitting: Family

## 2024-03-14 ENCOUNTER — Encounter: Payer: Self-pay | Admitting: Family

## 2024-03-14 VITALS — BP 122/78 | HR 89 | Temp 97.5°F | Ht 64.0 in | Wt 301.0 lb

## 2024-03-14 DIAGNOSIS — Z114 Encounter for screening for human immunodeficiency virus [HIV]: Secondary | ICD-10-CM | POA: Diagnosis not present

## 2024-03-14 DIAGNOSIS — E1169 Type 2 diabetes mellitus with other specified complication: Secondary | ICD-10-CM

## 2024-03-14 NOTE — Progress Notes (Signed)
 Subjective:    Patient ID: Glenda Mitchell, female    DOB: 02/01/1976, 48 y.o.   MRN: [id number]  Chief Complaint  Patient presents with   discuss blood work     HPI PT presents to the office today to discussed lab work. [facility] and was given paper work that had, HIV antibodies reactive, but her second confirmatory and HIV final was negative. She is very anxious and wants this rechecked.   She is diabetic and her last A1C was 7.9. She is taking Mounjaro  15 mg and tresiba  55-60 units.   Review of Systems  All other systems reviewed and are negative.   Social History   Socioeconomic History   Marital status: Married    Spouse name: Not on file   Number of children: 3   Years of education: Not on file   Highest education level: GED or equivalent  Occupational History   Not on file  Tobacco Use   Smoking status: Never   Smokeless tobacco: Never  Vaping Use   Vaping status: Never Used  Substance and Sexual Activity   Alcohol use: Never   Drug use: Never   Sexual activity: Yes    Birth control/protection: None  Other Topics Concern   Not on file  Social History Narrative   Not on file   Social Drivers of Health   Financial Resource Strain: Low Risk  (03/13/2024)   Overall Financial Resource Strain (CARDIA)    Difficulty of Paying Living Expenses: Not hard at all  Food Insecurity: No Food Insecurity (03/13/2024)   Hunger Vital Sign    Worried About Running Out of Food in the Last Year: Never true    Ran Out of Food in the Last Year: Never true  Transportation Needs: No Transportation Needs (03/13/2024)   PRAPARE - Administrator, Civil Service (Medical): No    Lack of Transportation (Non-Medical): No  Physical Activity: Inactive (03/13/2024)   Exercise Vital Sign    Days of Exercise per Week: 0 days    Minutes of Exercise per Session: Not on file  Stress: No Stress Concern Present (03/13/2024)   [employer] of  Occupational Health - Occupational Stress Questionnaire    Feeling of Stress: Only a little  Social Connections: Socially Integrated (03/13/2024)   Social Connection and Isolation Panel    Frequency of Communication with Friends and Family: More than three times a week    Frequency of Social Gatherings with Friends and Family: Twice a week    Attends Religious Services: More than 4 times per year    Active Member of [employer] or Organizations: Yes    Attends Engineer, Structural: More than 4 times per year    Marital Status: Married   Family History  Problem Relation Age of Onset   Diabetes Mother    Cancer Mother        breast/lung   Heart attack Father    Hypertension Father    Diabetes Sister    Heart disease Sister    Kidney disease Sister    Cancer Maternal Grandmother        lung   Lung cancer Maternal Grandmother    Colon cancer Neg Hx    Esophageal cancer Neg Hx    Rectal cancer Neg Hx    Stomach cancer Neg Hx    Colon polyps Neg Hx         Objective:   Physical  Exam Vitals reviewed.  Constitutional:      General: She is not in acute distress.    Appearance: She is well-developed. She is obese.  HENT:     Head: Normocephalic and atraumatic.     Right Ear: Tympanic membrane normal.     Left Ear: Tympanic membrane normal.  Eyes:     Pupils: Pupils are equal, round, and reactive to light.  Neck:     Thyroid: No thyromegaly.  Cardiovascular:     Rate and Rhythm: Normal rate and regular rhythm.     Heart sounds: Normal heart sounds. No murmur heard. Pulmonary:     Effort: Pulmonary effort is normal. No respiratory distress.     Breath sounds: Normal breath sounds. No wheezing.  Abdominal:     General: Bowel sounds are normal. There is no distension.     Palpations: Abdomen is soft.     Tenderness: There is no abdominal tenderness.  Musculoskeletal:        General: No tenderness. Normal range of motion.     Cervical back: Normal range of motion and  neck supple.  Skin:    General: Skin is warm and dry.  Neurological:     Mental Status: She is alert and oriented to person, place, and time.     Cranial Nerves: No cranial nerve deficit.     Deep Tendon Reflexes: Reflexes are normal and symmetric.  Psychiatric:        Behavior: Behavior normal.        Thought Content: Thought content normal.        Judgment: Judgment normal.       BP 122/78   Pulse 89   Temp (!) 97.5 F (36.4 C) (Temporal)   Ht 5' 4 (1.626 m)   Wt (!) 301 lb (136.5 kg)   SpO2 95%   BMI 51.67 kg/m      Assessment & Plan:  Glenda Mitchell comes in today with chief complaint of discuss blood work    Diagnosis and orders addressed:  1. Encounter for screening for HIV More than likely false positive Will recheck  - HIV Antibody (routine testing w rflx)  2. Type 2 diabetes mellitus with other specified complication, without long-term current use of insulin  (HCC) (Primary) Continue current medications  Low carb       Bari Learn, FNP

## 2024-03-14 NOTE — Patient Instructions (Signed)
 HIV Antibody Test Why am I having this test? The HIV antibody test is used to screen for the human immunodeficiency virus (HIV), the virus that causes AIDS (acquired immunodeficiency syndrome). The Centers for Disease Control and Prevention (CDC) recommends that everyone between the ages of 59 and 5 have this test at least once. For those at higher risk, the CDC recommends being tested every year. Your health care provider may recommend this test if: You are pregnant or planning on becoming pregnant. You have had sex with someone who is or might be HIV-positive. You have had unprotected sex with more than one partner. You are a medical laboratory scientific officer and were exposed to HIV-infected blood. You have ever injected illegal drugs. You have ever exchanged sex for money or drugs. You have been diagnosed with hepatitis, tuberculosis, or a sexually transmitted infection (STI). You are a female who has sex with other males. You are exhibiting symptoms of AIDS. You had a blood transfusion before 1985. It is possible to have HIV without any symptoms. There is no cure for HIV, but starting treatment early helps you stay healthy longer. If you know you have HIV (are HIV-positive), you can also make changes to prevent transferring the virus to other people. What is being tested? This test detects the proteins (antibodies) that the body's defense system (immune system) makes to fight the infection after exposure to HIV. Having antibodies for HIV does not mean that you have AIDS or will get AIDS. HIV tests can be performed in a clinic, lab, or hospital setting. There are also home testing kits. What kind of sample is taken?     The HIV antibody test requires either a blood sample or a sample of the fluid from inside your mouth (oral fluid). For the blood test, a blood sample is drawn from a vein in your hand or arm or by sticking a finger with a small needle. For the test using oral fluid, your upper and lower gums are  swabbed. If you decide to do a home HIV test, follow the package instructions carefully. There are two types of home testing kits: Blood test. You will send your test kit that includes a small sample of your blood to the manufacturer for processing. You will receive your results from the manufacturer. Oral fluid test. This test is completed at home and usually gives you results in 20-40 minutes. Up to 1 in 12 infected people may have a false-negative result with the saliva test. This means that you could have a negative result even if you are infected. How are the results reported? Your test results will be reported as either positive or negative for antibodies of HIV. Sometimes, the test results may report that a condition is present when it is not (false-positive result). Sometimes, the test results may report that a condition is not present when it is present (false-negative result). Your health care provider will talk to you about doing more tests to confirm your results. What do the results mean? If your HIV antibody test is negative, it means that there were no antibodies in your blood at the time of the test. It is important to know that it can take 1-3 months to develop antibodies after infection. If you may have been infected within the previous 3 months, your health care provider may recommend that you repeat the test after that time period. If your HIV antibody test is positive, it means that the test detected antibodies of HIV in your  sample. You will need to have another type of test to confirm the results of the initial test. A positive result to the HIV antibody test does not necessarily mean that you will develop AIDS. Talk with your health care provider about what your results mean. Questions to ask your health care provider Ask your health care provider, or the department that is doing the test: When will my results be ready? How will I get my results? What are my treatment  options? What other tests do I need? What are my next steps? Summary The HIV antibody test is used to screen for the human immunodeficiency virus (HIV), the virus that causes AIDS. It is possible to have HIV without any symptoms. There is no cure for HIV, but starting treatment early helps you stay healthy longer. If you know you have HIV (are HIV-positive), you can also make changes to prevent transferring the virus to other people. If your HIV antibody test is negative, it means that there were no antibodies in your blood at the time of the test. It is important to know that it can take 1-3 months to develop antibodies after infection. If you may have been infected within the previous 3 months, your health care provider may recommend that you repeat the test after that time period. If your HIV antibody test is positive, it means that the test detected antibodies of HIV in your sample. You will need to have another type of test to confirm the results of the initial test. A positive result to the HIV antibody test does not necessarily mean that you will develop AIDS. This information is not intended to replace advice given to you by your health care provider. Make sure you discuss any questions you have with your health care provider. Document Revised: 10/23/2019 Document Reviewed: 10/23/2019 Elsevier Patient Education  2024 [employer].

## 2024-03-15 ENCOUNTER — Ambulatory Visit: Payer: Self-pay | Admitting: Family

## 2024-03-15 LAB — HIV ANTIBODY (ROUTINE TESTING W REFLEX): HIV Screen 4th Generation wRfx: NONREACTIVE

## 2024-04-01 ENCOUNTER — Ambulatory Visit: Payer: Self-pay

## 2024-04-01 ENCOUNTER — Ambulatory Visit: Admitting: Family

## 2024-04-01 ENCOUNTER — Encounter: Payer: Self-pay | Admitting: Family

## 2024-04-01 ENCOUNTER — Ambulatory Visit

## 2024-04-01 VITALS — BP 106/68 | HR 78 | Temp 98.5°F | Ht 64.0 in | Wt 295.8 lb

## 2024-04-01 DIAGNOSIS — M25571 Pain in right ankle and joints of right foot: Secondary | ICD-10-CM

## 2024-04-01 DIAGNOSIS — M19171 Post-traumatic osteoarthritis, right ankle and foot: Secondary | ICD-10-CM

## 2024-04-01 NOTE — Telephone Encounter (Signed)
 Appt made.

## 2024-04-01 NOTE — Progress Notes (Signed)
 Subjective:    Patient ID: Glenda Mitchell, female    DOB: Jun 07, 1975, 48 y.o.   MRN: [id number]  Chief Complaint  Patient presents with   Ankle Pain   Pt presents to the office today with right ankle pain and swelling that started 4 days ago. Denies any injury. She has had surgery on her ankle in the past. Her x-ray on 01/09/23 showed, No acute finding. Lateral soft tissue swelling. 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 Possible small intra-articular loose body anterior to the talar dome. No change suspected since the prior study.  She does report being more active.  Ankle Pain  The incident occurred more than 1 week ago. There was no injury mechanism. The pain is present in the left ankle. The quality of the pain is described as aching. The pain is at a severity of 9/10. The pain is moderate. The pain has been Constant since onset. Pertinent negatives include no inability to bear weight, loss of motion, loss of sensation, numbness or tingling. She reports no foreign bodies present. The symptoms are aggravated by movement and weight bearing. She has tried NSAIDs, elevation and acetaminophen for the symptoms. The treatment provided mild relief.      Review of Systems  Neurological:  Negative for tingling and numbness.  All other systems reviewed and are negative.   Social History   Socioeconomic History   Marital status: Married    Spouse name: Not on file   Number of children: 3   Years of education: Not on file   Highest education level: GED or equivalent  Occupational History   Not on file  Tobacco Use   Smoking status: Never   Smokeless tobacco: Never  Vaping Use   Vaping status: Never Used  Substance and Sexual Activity   Alcohol use: Never   Drug use: Never   Sexual activity: Yes    Birth control/protection: None  Other Topics Concern   Not on file  Social History Narrative   Not on file   Social Drivers of Health    Financial Resource Strain: Low Risk  (03/13/2024)   Overall Financial Resource Strain (CARDIA)    Difficulty of Paying Living Expenses: Not hard at all  Food Insecurity: No Food Insecurity (03/13/2024)   Hunger Vital Sign    Worried About Running Out of Food in the Last Year: Never true    Ran Out of Food in the Last Year: Never true  Transportation Needs: No Transportation Needs (03/13/2024)   PRAPARE - Administrator, Civil Service (Medical): No    Lack of Transportation (Non-Medical): No  Physical Activity: Inactive (03/13/2024)   Exercise Vital Sign    Days of Exercise per Week: 0 days    Minutes of Exercise per Session: Not on file  Stress: No Stress Concern Present (03/13/2024)   [employer] of Occupational Health - Occupational Stress Questionnaire    Feeling of Stress: Only a little  Social Connections: Socially Integrated (03/13/2024)   Social Connection and Isolation Panel    Frequency of Communication with Friends and Family: More than three times a week    Frequency of Social Gatherings with Friends and Family: Twice a week    Attends Religious Services: More than 4 times per year    Active Member of [employer] or Organizations: Yes    Attends Engineer, Structural: More than 4 times per year    Marital Status: Married  Family History  Problem Relation Age of Onset   Diabetes Mother    Cancer Mother        breast/lung   Heart attack Father    Hypertension Father    Diabetes Sister    Heart disease Sister    Kidney disease Sister    Cancer Maternal Grandmother        lung   Lung cancer Maternal Grandmother    Colon cancer Neg Hx    Esophageal cancer Neg Hx    Rectal cancer Neg Hx    Stomach cancer Neg Hx    Colon polyps Neg Hx         Objective:   Physical Exam Vitals reviewed.  Constitutional:      General: She is not in acute distress.    Appearance: She is well-developed. She is obese.  HENT:     Head: Normocephalic and  atraumatic.  Eyes:     Pupils: Pupils are equal, round, and reactive to light.  Neck:     Thyroid: No thyromegaly.  Cardiovascular:     Rate and Rhythm: Normal rate and regular rhythm.     Heart sounds: Normal heart sounds. No murmur heard. Pulmonary:     Effort: Pulmonary effort is normal. No respiratory distress.     Breath sounds: Normal breath sounds. No wheezing.  Abdominal:     General: Bowel sounds are normal. There is no distension.     Palpations: Abdomen is soft.     Tenderness: There is no abdominal tenderness.  Musculoskeletal:        General: Swelling and tenderness (lateral ankle swelling and tenderness) present.     Cervical back: Normal range of motion and neck supple.     Comments: Able to flex right ankle, unable to rotate ankle. This is not new. Has not been able to have full ROM since her last surgery in 20+ years ago  Skin:    General: Skin is warm and dry.  Neurological:     Mental Status: She is alert and oriented to person, place, and time.     Cranial Nerves: No cranial nerve deficit.     Deep Tendon Reflexes: Reflexes are normal and symmetric.  Psychiatric:        Behavior: Behavior normal.        Thought Content: Thought content normal.        Judgment: Judgment normal.       BP 106/68   Pulse 78   Temp 98.5 F (36.9 C) (Temporal)   Ht 5' 4 (1.626 m)   Wt 295 lb 12.8 oz (134.2 kg)   SpO2 96%   BMI 50.77 kg/m      Assessment & Plan:  DEIONDRA DENLEY comes in today with chief complaint of Ankle Pain   Diagnosis and orders addressed:  1. Acute right ankle pain (Primary) - DG Ankle Complete Right; Future  2. Post-traumatic osteoarthritis of right ankle - DG Ankle Complete Right; Future   Rest Ice Ankle brace given today, compression Elevate Continue Celebrex  daily with food, no other NSAID's  Will hold off on prednisone  given uncontrolled DM Keep chronic follow up    Bari Learn, FNP

## 2024-04-01 NOTE — Telephone Encounter (Signed)
 FYI Only or Action Required?: FYI only for provider: appointment scheduled on 04/01/24.  Patient was last seen in primary care on 03/14/2024 by [name] LABOR, FNP.  Called Nurse Triage reporting Joint Swelling.  Symptoms began several days ago.  Interventions attempted: OTC medications: tylenol, Ice/heat application, and Other: ankle brace.  Symptoms are: unchanged.  Triage Disposition: See PCP When Office is Open (Within 3 Days)  Patient/caregiver understands and will follow disposition?:   Copied from CRM #[id number]. Topic: Clinical - Red Word Triage >> Apr 01, 2024 10:06 AM Graeme ORN wrote: Red Word that prompted transfer to Nurse Triage: Right Ankle pain hurting really bad swelling since Thursday    ----------------------------------------------------------------------- From previous Reason for Contact - Scheduling: Patient/patient representative is calling to schedule an appointment. Refer to attachments for appointment information. Reason for Disposition  MODERATE ankle swelling (e.g., interferes with normal activities, can't move joint normally) (Exceptions: Itchy, localized swelling; swelling is chronic.)  Answer Assessment - Initial Assessment Questions 1. LOCATION: Which ankle is swollen? Where is the swelling?     R ankle   2. ONSET: When did the swelling start?     Thursday, 11/27  3. SWELLING: How bad is the swelling? Or, How large is it? (e.g., mild, moderate, severe; size of localized swelling)      Moderate; swelling at the ankle only. Not extending up leg or down feet  4. PAIN: Is there any pain? If Yes, ask: How bad is it? (Scale 0-10; or none, mild, moderate, severe)     Moderate  5. CAUSE: What do you think caused the ankle swelling?     Unknown; pt stated she had ankle surgery 28 years ago but has not had a recent injury to ankle   6. OTHER SYMPTOMS: Do you have any other symptoms? (e.g., fever, chest pain, difficulty breathing, calf  pain)     None  Protocols used: Ankle Swelling-A-AH

## 2024-04-01 NOTE — Progress Notes (Unsigned)
 Glenda Console, PA-C [address] [place], [place]  [address] Phone: [phone]   Gastroenterology Consultation  Referring Provider:     [name] LABOR, FNP Primary Care Physician:  [name] LABOR, FNP Primary Gastroenterologist:  Glenda Console, PA-C / Dr. Inocente Hausen Reason for Consultation:     Discuss colonoscopy        HPI:   Discussed the use of AI scribe software for clinical note transcription with the patient, who gave verbal consent to proceed.  48 year old female is referred to discuss scheduling first screening colonoscopy.  She has never had a colonoscopy.  No previous GI evaluation.  She has morbid obesity with BMI 50.75 today. History of Present Illness She has no gastrointestinal symptoms such as diarrhea, constipation, or blood in her stool. She experiences occasional diarrhea related to metformin  use, which varies with her diet, but no constipation.  She has a history of acid reflux managed with pantoprazole , which is effective, and she occasionally uses Tums.  She denies breakthrough heartburn or dysphagia.  She is on Mounjaro  and experiences occasional nausea, which she manages by taking the medication at night. She has been on this medication for a couple of years and notes that the nausea is manageable.  She denies vomiting.  There is no known family history of colon cancer.   PMH: Diabetes, gout, hypertension, morbid obesity, anxiety, depression, PCOS, hyperlipidemia, GERD.    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  She has three children (2 boys, 1 girl) involved in Scouts and sports activities.  Past Medical History:  Diagnosis Date   Anxiety    Arthritis    Diabetes mellitus without complication (HCC)    Gout    Hypertension    Obesity    Sleep apnea     Past Surgical History:  Procedure Laterality Date   DILATION AND CURETTAGE OF UTERUS     foot surgery, right      Prior to Admission medications   Medication Sig Start Date End Date Taking?  Authorizing Provider  allopurinol  (ZYLOPRIM ) 100 MG tablet Take 2 tablets (200 mg total) by mouth daily. 07/28/23   [name] LABOR, FNP  atorvastatin  (LIPITOR) 20 MG tablet Take 1 tablet (20 mg total) by mouth daily. 02/08/24   [name] A, FNP  Blood Glucose Monitoring Suppl (ONETOUCH VERIO) w/Device KIT 1 kit by Does not apply route 3 (three) times daily before meals. 06/15/23   [name] A, FNP  celecoxib  (CELEBREX ) 200 MG capsule Take 1 capsule (200 mg total) by mouth 2 (two) times daily. 02/08/24   [name] LABOR, FNP  cetirizine  (ZYRTEC ) 10 MG tablet Take 1 tablet by mouth once daily 02/02/24   [name] A, FNP  Cholecalciferol (VITAMIN D -3 PO) Take 1,000 Units by mouth 3 (three) times a week.    [provider]  Continuous Glucose Transmitter (DEXCOM G6 TRANSMITTER) MISC Use to check Blood glucose up to 4 times daily Dx E11.9 12/08/23   [name] A, FNP  escitalopram  (LEXAPRO ) 10 MG tablet Take 1 tablet (10 mg total) by mouth in the morning and at bedtime. 02/08/24   [name] LABOR, FNP  fluticasone  (FLONASE ) 50 MCG/ACT nasal spray Use 2 spray(s) in each nostril once daily 01/11/24   [name] A, FNP  glucose blood (ONETOUCH VERIO) test strip Test BS daily Dx E11.9 08/11/20   [name] A, FNP  insulin  degludec (TRESIBA  FLEXTOUCH) 200 UNIT/ML FlexTouch Pen Inject up to 60  units subcutaneously daily 02/08/24   Lavell Lye A, FNP  Lancets [facility] ULTRASOFT) lancets Use as instructed 07/09/19   Joshua Clayborne RAMAN, PA-C  losartan  (COZAAR ) 25 MG tablet Take 1 tablet (25 mg total) by mouth daily. 02/08/24   Lavell Lye A, FNP  medroxyPROGESTERone  (PROVERA ) 10 MG tablet Take 20 mg by mouth daily. 12/29/23   [provider]  metFORMIN  (GLUCOPHAGE ) 1000 MG tablet Take 1 tablet (1,000 mg total) by mouth 2 (two) times daily. 02/08/24   Lavell Lye LABOR, FNP  ondansetron  (ZOFRAN ) 4 MG tablet Take 1 tablet (4 mg total) by mouth every 8 (eight) hours as needed for  nausea or vomiting. 01/19/24   Armbruster, Elspeth SQUIBB, MD  pantoprazole  (PROTONIX ) 20 MG tablet Take 1 tablet (20 mg total) by mouth 2 (two) times daily. 02/08/24   Lavell Lye A, FNP  tirzepatide  (MOUNJARO ) 15 MG/0.5ML Pen Inject 15 mg into the skin once a week. 02/08/24   Lavell Lye LABOR, FNP    Family History  Problem Relation Age of Onset   Diabetes Mother    Cancer Mother        breast/lung   Heart attack Father    Hypertension Father    Diabetes Sister    Heart disease Sister    Kidney disease Sister    Cancer Maternal Grandmother        lung   Lung cancer Maternal Grandmother    Colon cancer Neg Hx    Esophageal cancer Neg Hx    Rectal cancer Neg Hx    Stomach cancer Neg Hx    Colon polyps Neg Hx      Social History   Tobacco Use   Smoking status: Never   Smokeless tobacco: Never  Vaping Use   Vaping status: Never Used  Substance Use Topics   Alcohol use: Never   Drug use: Never    Allergies as of 04/02/2024 - Review Complete 04/02/2024  Allergen Reaction Noted   Clarithromycin Other (See Comments) 03/28/2017   Ivp dye [iodinated contrast media] Other (See Comments) 11/15/2019   Jardiance [empagliflozin] Other (See Comments) 11/15/2019    Review of Systems:    All systems reviewed and negative except where noted in HPI.   Physical Exam:  BP 108/66   Pulse 84   Ht 5' 4 (1.626 m)   Wt 295 lb 8 oz (134 kg)   LMP 01/18/2024 (Approximate)   BMI 50.72 kg/m  Patient's last menstrual period was 01/18/2024 (approximate).  General:   Alert,  Well-developed, well-nourished, obese, pleasant and cooperative in NAD Lungs:  Respirations even and unlabored.  Clear throughout to auscultation.   No wheezes, crackles, or rhonchi. No acute distress. Heart:  Regular rate and rhythm; no murmurs, clicks, rubs, or gallops. Abdomen:  Normal bowel sounds.  No bruits.  Soft, and without masses, hepatosplenomegaly or hernias noted.  No Tenderness.  No guarding or rebound  tenderness.    Neurologic:  Alert and oriented x3;  grossly normal neurologically. Psych:  Alert and cooperative. Normal mood and affect.   Imaging Studies: No results found.  Labs: CBC    Component Value Date/Time   WBC 9.1 09/29/2023 0940   RBC 4.77 09/29/2023 0940   HGB 12.8 09/29/2023 0940   HCT 40.4 09/29/2023 0940   PLT 312 09/29/2023 0940   MCV 85 09/29/2023 0940    CMP     Component Value Date/Time   NA 139 02/08/2024 1114   K 4.7 02/08/2024 1114  CL 99 02/08/2024 1114   CO2 25 02/08/2024 1114   GLUCOSE 148 (H) 02/08/2024 1114   BUN 16 02/08/2024 1114   CREATININE 0.56 (L) 02/08/2024 1114   CALCIUM  9.7 02/08/2024 1114   PROT 6.7 02/08/2024 1114   ALBUMIN 4.1 02/08/2024 1114   AST 11 02/08/2024 1114   ALT 10 02/08/2024 1114   ALKPHOS 91 02/08/2024 1114   BILITOT 0.5 02/08/2024 1114   GFRNONAA 113 01/31/2020 1255   GFRAA 131 01/31/2020 1255    Assessment and Plan:   TERETHA CHALUPA is a 48 y.o. y/o female has been referred for:   1.  Colon cancer screening.  Normal risk for colon cancer. - Scheduling Colonoscopy I discussed risks of colonoscopy with patient to include risk of bleeding, colon perforation, and risk of sedation.  Patient expressed understanding and agrees to proceed with colonoscopy.   2.  Morbid obesity, BMI greater than 50 - Schedule colonoscopy in hospital.  3.  GERD: Controlled on current treatment - Continue pantoprazole  20 mg twice daily. - Okay to take OTC Tums or Pepcid as needed. - Recommend Lifestyle Modifications to prevent Acid Reflux.  Rec. Avoid coffee, sodas, peppermint, garlic, onions, alcohol, citrus fruits, chocolate, tomatoes, fatty and spicey foods.  Avoid eating 2-3 hours before bedtime.    4.  Type 2 diabetes mellitus Managed with Mounjaro , insulin  and metformin . Occasional nausea with Mounjaro , managed by taking it at night. - Hold Mounjaro  for one week before the colonoscopy. - Adjust diabetes medications to  half dose on the day before the procedure due to liquid diet. - Monitor blood glucose levels and consume sugary drinks if hypoglycemia occurs.   Follow up based on colonoscopy results and GI symptoms.  Glenda Console, PA-C   I have reviewed the clinic note as outlined by Glenda Console, PA and agree with the assessment, plan and medical decision making.  Glenda Mitchell is referred to the office for evaluation of screening colonoscopy.  She is average risk without a family history of colorectal cancer or polyps.  Has symptoms of diarrhea related to metformin  use but no underlying GI syndromes.  Agree with scheduling colonoscopy at hospital-based unit due to elevated BMI.  GERD well-controlled on pantoprazole  and can continue PPI.  Inocente Hausen, MD

## 2024-04-01 NOTE — Patient Instructions (Signed)
Ankle Pain The ankle joint helps you stand on your leg and allows you to move around. Ankle pain can happen on either side or the back of the ankle. You may have pain in one ankle or both ankles. Ankle pain may be sharp and burning or dull and aching. There may be tenderness, stiffness, redness, or warmth around the ankle. Many things can cause ankle pain. These include an injury to the area and overuse of your ankle. Follow these instructions at home: Activity Rest your ankle as told by your health care provider. Avoid doing things that cause ankle pain. Do not use the injured limb to support your body weight until your provider says that you can. Use crutches as told by your provider. Ask your provider when it is safe to drive if you have a brace on your ankle. Do exercises as told by your provider. If you have a removable brace: Wear the brace as told by your provider. Remove it only as told by your provider. Check the skin around the brace every day. Tell your provider about any concerns. Loosen the brace if your toes tingle, become numb, or turn cold and blue. Keep the brace clean. If the brace is not waterproof: Do not let it get wet. Cover it with a watertight covering when you take a bath or shower. If you have an elastic bandage:  Remove it when you take a bath or a shower. Try not to move your ankle much. Wiggle your toes from time to time. This helps to prevent swelling. Adjust the bandage if it feels too tight. Loosen the bandage if your foot tingles, becomes numb, or turns cold and blue. Managing pain, stiffness, and swelling  If told, put ice on the painful area. If you have a removable brace or elastic bandage, remove it as told by your provider. Put ice in a plastic bag. Place a towel between your skin and the bag. Leave the ice on for 20 minutes, 2-3 times a day. If your skin turns bright red, remove the ice right away to prevent skin damage. The risk of damage is  higher if you cannot feel pain, heat, or cold. Move your toes often to reduce stiffness and swelling. Raise (elevate) your ankle above the level of your heart while you are sitting or lying down. General instructions Take over-the-counter and prescription medicines only as told by your provider. To help you and your provider, write down: How often you have ankle pain. Where the pain is. What the pain feels like. If you are told to wear a certain shoe or insole, make sure you wear it the right way and for as long as you are told. Contact a health care provider if: Your pain gets worse. Your pain does not get better with medicine. You have a fever or chills. You have more trouble walking. You have new symptoms. Your foot, leg, toes, or ankle tingles, becomes numb or swollen, or turns cold and blue. This information is not intended to replace advice given to you by your health care provider. Make sure you discuss any questions you have with your health care provider. Document Revised: 02/09/2022 Document Reviewed: 02/09/2022 Elsevier Patient Education  2024 [employer].

## 2024-04-02 ENCOUNTER — Encounter: Payer: Self-pay | Admitting: Physician Assistant

## 2024-04-02 ENCOUNTER — Ambulatory Visit (INDEPENDENT_AMBULATORY_CARE_PROVIDER_SITE_OTHER): Admitting: Physician Assistant

## 2024-04-02 VITALS — BP 108/66 | HR 84 | Ht 64.0 in | Wt 295.5 lb

## 2024-04-02 DIAGNOSIS — Z6841 Body Mass Index (BMI) 40.0 and over, adult: Secondary | ICD-10-CM | POA: Diagnosis not present

## 2024-04-02 DIAGNOSIS — K219 Gastro-esophageal reflux disease without esophagitis: Secondary | ICD-10-CM

## 2024-04-02 DIAGNOSIS — Z1211 Encounter for screening for malignant neoplasm of colon: Secondary | ICD-10-CM | POA: Diagnosis not present

## 2024-04-02 DIAGNOSIS — E119 Type 2 diabetes mellitus without complications: Secondary | ICD-10-CM

## 2024-04-02 MED ORDER — NA SULFATE-K SULFATE-MG SULF 17.5-3.13-1.6 GM/177ML PO SOLN: 1.0000 | Freq: Once | ORAL | 0 refills | Status: AC

## 2024-04-02 NOTE — Patient Instructions (Signed)
 You have been scheduled for a Colonoscopy. Please follow written instructions given to you at your visit today.   If you use inhalers (even only as needed), please bring them with you on the day of your procedure.  DO NOT TAKE 7 DAYS PRIOR TO TEST- Trulicity (dulaglutide) Ozempic , Wegovy  (semaglutide ) Mounjaro  (tirzepatide ) Bydureon Bcise (exanatide extended release)  DO NOT TAKE 1 DAY PRIOR TO YOUR TEST Rybelsus  (semaglutide ) Adlyxin (lixisenatide) Victoza (liraglutide) Byetta (exanatide) ___________________________________________________________________________  Please follow up sooner if symptoms increase or worsen   Due to recent changes in healthcare laws, you may see the results of your imaging and laboratory studies on MyChart before your provider has had a chance to review them.  We understand that in some cases there may be results that are confusing or concerning to you. Not all laboratory results come back in the same time frame and the provider may be waiting for multiple results in order to interpret others.  Please give us  48 hours in order for your provider to thoroughly review all the results before contacting the office for clarification of your results.   Thank you for trusting me with your gastrointestinal care!   [name], PA-C _______________________________________________________  If your blood pressure at your visit was 140/90 or greater, please contact your primary care physician to follow up on this.  _______________________________________________________  If you are age 18 or older, your body mass index should be between 23-30. Your Body mass index is 50.72 kg/m. If this is out of the aforementioned range listed, please consider follow up with your Primary Care Provider.  If you are age 70 or younger, your body mass index should be between 19-25. Your Body mass index is 50.72 kg/m. If this is out of the aformentioned range listed, please consider  follow up with your Primary Care Provider.   ________________________________________________________  The  GI providers would like to encourage you to use MYCHART to communicate with providers for non-urgent requests or questions.  Due to long hold times on the telephone, sending your provider a message by [facility] may be a faster and more efficient way to get a response.  Please allow 48 business hours for a response.  Please remember that this is for non-urgent requests.  _______________________________________________________

## 2024-04-04 ENCOUNTER — Telehealth: Payer: Self-pay

## 2024-04-04 NOTE — Telephone Encounter (Signed)
 Copied from CRM #[id number]. Topic: Clinical - Lab/Test Results >> Apr 04, 2024  4:40 PM Glenda Mitchell wrote: Reason for CRM: Patient is calling for X-ray results.  Call back #[phone]

## 2024-04-05 NOTE — Telephone Encounter (Signed)
 Patient aware the results are not back yet and we will call her once they are resulted.

## 2024-04-09 ENCOUNTER — Other Ambulatory Visit: Payer: Self-pay | Admitting: Family

## 2024-04-09 ENCOUNTER — Ambulatory Visit: Payer: Self-pay | Admitting: Family

## 2024-04-09 DIAGNOSIS — M51362 Other intervertebral disc degeneration, lumbar region with discogenic back pain and lower extremity pain: Secondary | ICD-10-CM

## 2024-04-09 DIAGNOSIS — E1169 Type 2 diabetes mellitus with other specified complication: Secondary | ICD-10-CM

## 2024-04-09 DIAGNOSIS — M4316 Spondylolisthesis, lumbar region: Secondary | ICD-10-CM

## 2024-04-09 DIAGNOSIS — H6992 Unspecified Eustachian tube disorder, left ear: Secondary | ICD-10-CM

## 2024-04-09 MED ORDER — ATORVASTATIN CALCIUM 20 MG PO TABS: 20.0000 mg | ORAL_TABLET | Freq: Every day | ORAL | 1 refills | Status: AC

## 2024-04-09 MED ORDER — METFORMIN HCL 1000 MG PO TABS: 1000.0000 mg | ORAL_TABLET | Freq: Two times a day (BID) | ORAL | 1 refills | Status: AC

## 2024-04-09 MED ORDER — CETIRIZINE HCL 10 MG PO TABS: 10.0000 mg | ORAL_TABLET | Freq: Every day | ORAL | 0 refills | Status: DC

## 2024-04-09 MED ORDER — CELECOXIB 200 MG PO CAPS: 200.0000 mg | ORAL_CAPSULE | Freq: Two times a day (BID) | ORAL | 1 refills | Status: AC

## 2024-04-09 NOTE — Telephone Encounter (Unsigned)
 Copied from CRM #[id number]. Topic: Clinical - Medication Refill >> Apr 09, 2024 12:38 PM Edsel HERO wrote: Medication:  cetirizine  (ZYRTEC ) 10 MG tablet celecoxib  (CELEBREX ) 200 MG capsule metFORMIN  (GLUCOPHAGE ) 1000 MG tablet atorvastatin  (LIPITOR) 20 MG tablet  Has the patient contacted their pharmacy? Yes  This is the patient's preferred pharmacy:  [facility] - [place], [place] - [address] [place] [address] Phone: [phone] Fax: [phone]  Is this the correct pharmacy for this prescription? Yes If no, delete pharmacy and type the correct one.   Has the prescription been filled recently? Yes  Is the patient out of the medication? Yes  Has the patient been seen for an appointment in the last year OR does the patient have an upcoming appointment? Yes  Can we respond through MyChart? Yes  Agent: Please be advised that Rx refills may take up to 3 business days. We ask that you follow-up with your pharmacy.

## 2024-04-19 ENCOUNTER — Encounter: Admitting: Gastroenterology

## 2024-04-22 ENCOUNTER — Ambulatory Visit: Payer: Self-pay

## 2024-04-22 NOTE — Telephone Encounter (Signed)
 FYI Only or Action Required?: FYI only for provider: appointment scheduled on 12.23.25.  Patient was last seen in primary care on 04/01/2024 by [name] LABOR, FNP.  Called Nurse Triage reporting Puncture Wound.  Symptoms began yesterday.  Interventions attempted: Other: cleaned with alcohol.  Symptoms are: gradually worsening.  Triage Disposition: See PCP When Office is Open (Within 3 Days)  Patient/caregiver understands and will follow disposition?: Yes    Copied from CRM #[id number]. Topic: Clinical - Red Word Triage >> Apr 22, 2024  4:03 PM Victoria B wrote: [facility] that prompted transfer to Nurse Triage: patient stepped on pin and lift foot at bottom is swollen Reason for Disposition  [1] Puncture wound of foot AND [2] diabetes mellitus  Answer Assessment - Initial Assessment Questions Yesterday morning stepped on a pin like from an award so thicker than a push pin, on the ball of her left foot. She states it went into her foot and she really had to pull to get it out. She states it is painful, slight redness, slight swelling, no pus. Pt is concerned as she is a diabetic. She states she did clean it with an alcohol wipe. Rn gave instructions on care until appt. Pt denies all higher acuity symptoms.    1. LOCATION: Where is the puncture located?      Bottom of left foot.  2. OBJECT: What was the object that punctured the skin?     An award pin 3. DEPTH: How deep do you think the puncture goes?      unknown 4. ONSET: When did the injury occur? (e.g, minutes, hours)     Yesterday morning 5. PAIN: Is there any pain? If Yes, ask: How bad is the pain? (Scale 0-10; or none, mild, moderate, severe)     Yes, 6-7 6. TETANUS: When was your last tetanus booster?     Per chart 2021  Protocols used: Puncture Wound-A-AH

## 2024-04-22 NOTE — Telephone Encounter (Signed)
 Apt scheduled.

## 2024-04-23 ENCOUNTER — Ambulatory Visit: Payer: Self-pay

## 2024-04-23 ENCOUNTER — Encounter: Payer: Self-pay | Admitting: Family

## 2024-04-23 ENCOUNTER — Ambulatory Visit: Admitting: Family

## 2024-04-23 VITALS — BP 129/87 | HR 109 | Temp 98.2°F | Wt 292.0 lb

## 2024-04-23 DIAGNOSIS — S91332A Puncture wound without foreign body, left foot, initial encounter: Secondary | ICD-10-CM

## 2024-04-23 DIAGNOSIS — R6889 Other general symptoms and signs: Secondary | ICD-10-CM | POA: Diagnosis not present

## 2024-04-23 LAB — VERITOR SARS-COV-2 AND FLU A+B
BD Veritor SARS-CoV-2 Ag: NEGATIVE
Influenza A: NEGATIVE
Influenza B: NEGATIVE

## 2024-04-23 MED ORDER — BENZONATATE 200 MG PO CAPS: 200.0000 mg | ORAL_CAPSULE | Freq: Two times a day (BID) | ORAL | 0 refills | Status: AC | PRN

## 2024-04-23 MED ORDER — AMOXICILLIN-POT CLAVULANATE 875-125 MG PO TABS: 1.0000 | ORAL_TABLET | Freq: Two times a day (BID) | ORAL | 0 refills | Status: AC

## 2024-04-23 NOTE — Patient Instructions (Signed)
Puncture Wound A puncture wound is an injury that is caused by a sharp, thin object that penetrates your skin. Usually, a puncture wound does not leave a large opening in your skin, so it may not bleed a lot. However, when you get a puncture wound, dirt or other materials (foreign bodies) can be forced into your wound and can break off inside. This increases the chance of infection, such as tetanus. There are many sharp, pointed objects that can cause puncture wounds, including teeth, nails, splinters of glass, fishhooks, and needles. Treatment may include washing out the wound with a germ-free (sterile) salt-water solution and having the wound opened surgically to remove a foreign object. The wound may be closed with stitches (sutures), skin glue, or adhesive strips and covered with antibiotic ointment and a bandage (dressing). Depending on what caused the injury, you may also need a tetanus shot or a rabies shot. Follow these instructions at home: Medicines Take or apply over-the-counter and prescription medicines only as told by your health care provider. If you were prescribed antibiotics, take or apply them as told by your health care provider. Do not stop using the antibiotic even if you start to feel better. Bathing Keep the dressing clean and dry as told by your health care provider. Do not take baths, swim, or use a hot tub until your health care provider approves. Ask your health care provider if you may take showers. You may only be allowed to take sponge baths. Wound care  Follow instructions from your health care provider about how to take care of your wound. Make sure you: Wash your hands with soap and water for at least 20 seconds before and after you change your dressing. If soap and water are not available, use hand sanitizer. Change your dressing as told by your health care provider. Leave sutures, skin glue, or adhesive strips in place. These skin closures may need to stay in place  for 2 weeks or longer. If adhesive strip edges start to loosen and curl up, you may trim the loose edges. Do not remove adhesive strips completely unless your health care provider tells you to do that. Clean the wound as told by your health care provider. Do not scratch or pick at the wound. Check your wound every day for signs of infection. Check for: Redness, swelling, or pain. Fluid or blood. Warmth. Pus or a bad smell. General instructions Raise (elevate) the injured area above the level of your heart while you are sitting or lying down. If your puncture wound is in your foot, ask your health care provider if you need to avoid putting weight on your foot and for how long. Do not use the injured limb to support your body weight until your health care provider says that you can. Use crutches as told by your health care provider. Keep all follow-up visits. This is important. Contact a health care provider if: You received a tetanus or rabies shot and you have swelling, severe pain, redness, or bleeding at the injection site. You have any of these signs of wound infection: Redness, swelling, or pain. Fluid or blood. Warmth. Pus or a bad smell. Your sutures come out. You notice something coming out of your wound, such as wood or glass. Your pain is not controlled with medicine. You develop numbness around your wound. Get help right away if: You develop severe swelling around your wound. You have a red streak going away from your wound. You develop painful skin  lumps. The wound is on your hand or foot and you: Cannot properly move a finger or toe. Notice that your fingers or toes look pale or bluish. Summary A puncture wound is an injury that is caused by a sharp, thin object that penetrates your skin. Treatment may include washing out the wound and having the wound opened surgically to remove a foreign object, The wound may be closed with stitches (sutures), skin glue, or adhesive  strips and covered with antibiotic ointment and a bandage (dressing). Follow instructions from your health care provider about how to take care of your wound. Contact a health care provider if you have redness, swelling, or pain at the site of your wound. This information is not intended to replace advice given to you by your health care provider. Make sure you discuss any questions you have with your health care provider. Document Revised: 05/19/2021 Document Reviewed: 05/19/2021 Elsevier Patient Education  2024 [employer].

## 2024-04-23 NOTE — Progress Notes (Signed)
 "  Subjective:    Patient ID: Glenda Mitchell, female    DOB: [birth year], 48 y.o.   MRN: [id number]  Chief Complaint  Patient presents with   Puncture Wound   Cough    Children tested positive for the flu   Pt presents to the office today with left foot pain. She stepped on a pin on the ball of her foot two days ago. She reports mild pain of  out 10. She is an uncontrol diabetic. Her TDAP is UTD.    She is complaining of cough for two days and fever. Her son is positive Flu A.  Cough This is a new problem. The current episode started in the past 7 days. The problem has been unchanged. The problem occurs every few minutes. The cough is Non-productive. Associated symptoms include chills (resolved now), ear congestion (right), ear pain, a fever, myalgias (yesterday), nasal congestion, postnasal drip, a sore throat and wheezing. Pertinent negatives include no shortness of breath. She has tried rest (tylenol) for the symptoms. The treatment provided mild relief.      Review of Systems  Constitutional:  Positive for chills (resolved now) and fever.  HENT:  Positive for ear pain, postnasal drip and sore throat.   Respiratory:  Positive for cough and wheezing. Negative for shortness of breath.   Musculoskeletal:  Positive for myalgias (yesterday).  All other systems reviewed and are negative.   Social History   Socioeconomic History   Marital status: Married    Spouse name: Not on file   Number of children: 3   Years of education: Not on file   Highest education level: GED or equivalent  Occupational History   Not on file  Tobacco Use   Smoking status: Never   Smokeless tobacco: Never  Vaping Use   Vaping status: Never Used  Substance and Sexual Activity   Alcohol use: Never   Drug use: Never   Sexual activity: Yes    Birth control/protection: None  Other Topics Concern   Not on file  Social History Narrative   Not on file   Social Drivers of Health   Tobacco Use: Low  Risk (04/23/2024)   Patient History    Smoking Tobacco Use: Never    Smokeless Tobacco Use: Never    Passive Exposure: Not on file  Financial Resource Strain: Low Risk (03/13/2024)   Overall Financial Resource Strain (CARDIA)    Difficulty of Paying Living Expenses: Not hard at all  Food Insecurity: No Food Insecurity (03/13/2024)   Epic    Worried About Radiation Protection Practitioner of Food in the Last Year: Never true    Ran Out of Food in the Last Year: Never true  Transportation Needs: No Transportation Needs (03/13/2024)   Epic    Lack of Transportation (Medical): No    Lack of Transportation (Non-Medical): No  Physical Activity: Inactive (03/13/2024)   Exercise Vital Sign    Days of Exercise per Week: 0 days    Minutes of Exercise per Session: Not on file  Stress: No Stress Concern Present (03/13/2024)   [employer] of Occupational Health - Occupational Stress Questionnaire    Feeling of Stress: Only a little  Social Connections: Socially Integrated (03/13/2024)   Social Connection and Isolation Panel    Frequency of Communication with Friends and Family: More than three times a week    Frequency of Social Gatherings with Friends and Family: Twice a week    Attends Religious Services: More than 4  times per year    Active Member of Clubs or Organizations: Yes    Attends Banker Meetings: More than 4 times per year    Marital Status: Married  Depression (PHQ2-9): Low Risk (03/14/2024)   Depression (PHQ2-9)    PHQ-2 Score: 0  Alcohol Screen: Not on file  Housing: Low Risk (03/13/2024)   Epic    Unable to Pay for Housing in the Last Year: No    Number of Times Moved in the Last Year: 0    Homeless in the Last Year: No  Utilities: Not At Risk (12/08/2023)   Epic    Threatened with loss of utilities: No  Health Literacy: Not on file   Family History  Problem Relation Age of Onset   Diabetes Mother    Cancer Mother        breast/lung   Heart attack Father     Hypertension Father    Diabetes Sister    Heart disease Sister    Kidney disease Sister    Cancer Maternal Grandmother        lung   Lung cancer Maternal Grandmother    Colon cancer Neg Hx    Esophageal cancer Neg Hx    Rectal cancer Neg Hx    Stomach cancer Neg Hx    Colon polyps Neg Hx         Objective:   Physical Exam Vitals reviewed.  Constitutional:      General: She is not in acute distress.    Appearance: She is well-developed. She is obese.  HENT:     Head: Normocephalic and atraumatic.     Right Ear: Tympanic membrane normal.     Left Ear: Tympanic membrane normal.  Eyes:     Pupils: Pupils are equal, round, and reactive to light.  Neck:     Thyroid: No thyromegaly.  Cardiovascular:     Rate and Rhythm: Normal rate and regular rhythm.     Heart sounds: Normal heart sounds. No murmur heard. Pulmonary:     Effort: Pulmonary effort is normal. No respiratory distress.     Breath sounds: Normal breath sounds. No wheezing.  Abdominal:     General: Bowel sounds are normal. There is no distension.     Palpations: Abdomen is soft.     Tenderness: There is no abdominal tenderness.  Musculoskeletal:        General: Tenderness present. Normal range of motion.     Cervical back: Normal range of motion and neck supple.       Feet:  Feet:     Comments: Small puncture wound of ball of left foot, mild swelling. No erythemas or heat.  Skin:    General: Skin is warm and dry.  Neurological:     Mental Status: She is alert and oriented to person, place, and time.     Cranial Nerves: No cranial nerve deficit.     Deep Tendon Reflexes: Reflexes are normal and symmetric.  Psychiatric:        Behavior: Behavior normal.        Thought Content: Thought content normal.        Judgment: Judgment normal.       BP 129/87   Pulse (!) 109   Temp 98.2 F (36.8 C) (Temporal)   Wt 292 lb (132.5 kg)   LMP 01/18/2024 (Approximate)   BMI 50.12 kg/m      Assessment & Plan:   Glenda Mitchell comes in  today with chief complaint of Puncture Wound and Cough (Children tested positive for the flu)   Diagnosis and orders addressed:  1. Flu-like symptoms (Primary) Start tessalon   - Take meds as prescribed - Use a cool mist humidifier  -Use saline nose sprays frequently -Force fluids -For any cough or congestion  Use plain Mucinex- regular strength or max strength is fine -For fever or aces or pains- take tylenol or ibuprofen. -Throat lozenges if help -Follow up if symptoms worsen or do not improve  - Veritor SARS-CoV-2 and Flu A+B - benzonatate  (TESSALON ) 200 MG capsule; Take 1 capsule (200 mg total) by mouth 2 (two) times daily as needed for cough.  Dispense: 20 capsule; Refill: 0  2. Puncture wound of left foot, initial encounter Will given Augmentin  given holiday.  If foot worsen start Augmentin  Ice Tylenol as needed - amoxicillin -clavulanate (AUGMENTIN ) 875-125 MG tablet; Take 1 tablet by mouth 2 (two) times daily.  Dispense: 14 tablet; Refill: 0     Bari Learn, FNP   "

## 2024-04-23 NOTE — Telephone Encounter (Signed)
 FYI Only or Action Required?: Action required by provider: lab or test result follow-up needed.  Patient was last seen in primary care on 04/23/2024 by [name] LABOR, FNP.  Called Nurse Triage reporting Advice Only.  Symptoms began n/a.  Interventions attempted: Other: n/a.  Symptoms are: n/a.  Triage Disposition: Call PCP Within 24 Hours  Patient/caregiver understands and will follow disposition?: Yes   Copied from CRM #[id number]. Topic: Clinical - Red Word Triage >> Apr 23, 2024  4:46 PM [name] wrote: Red Word that prompted transfer to Nurse Triage: Pt requested to speak with NT to check if results from flu testing are out as she states she wants to know ASAP. Reason for Disposition  Caller requesting lab results  (Exception: Routine or non-urgent lab result.)  Answer Assessment - Initial Assessment Questions 1. REASON FOR CALL or QUESTION: What is your reason for calling today? or How can I best     Wanting to know lab results to know if she needs tamiflu  called in 2. CALLER: Document the source of call. (e.g., laboratory staff, caregiver or patient).     Pt  Not reviewed by provider yet, unable to give results.  Protocols used: PCP Call - No Triage-A-AH

## 2024-04-26 ENCOUNTER — Ambulatory Visit: Payer: Self-pay | Admitting: Family

## 2024-04-26 NOTE — Telephone Encounter (Signed)
 Please call and tell her it was negative. Sorry for the delay.

## 2024-04-26 NOTE — Telephone Encounter (Signed)
 Pt aware of negative results

## 2024-04-29 ENCOUNTER — Other Ambulatory Visit: Payer: Self-pay | Admitting: Family

## 2024-04-29 DIAGNOSIS — E1169 Type 2 diabetes mellitus with other specified complication: Secondary | ICD-10-CM

## 2024-05-01 ENCOUNTER — Encounter (HOSPITAL_COMMUNITY): Payer: Self-pay | Admitting: Pediatrics

## 2024-05-02 ENCOUNTER — Other Ambulatory Visit: Payer: Self-pay | Admitting: Family

## 2024-05-02 DIAGNOSIS — H6992 Unspecified Eustachian tube disorder, left ear: Secondary | ICD-10-CM

## 2024-05-08 ENCOUNTER — Telehealth: Payer: Self-pay | Admitting: Gastroenterology

## 2024-05-08 NOTE — Telephone Encounter (Addendum)
 Procedure:Colonoscopy Procedure date: 05/16/24 Procedure location: [facility] Arrival Time: 6:30 am Spoke with the patient Y/N: Yes Any prep concerns? No  Has the patient obtained the prep from the pharmacy ? No, not yet but will pick it up, patient was told to call back  if rx is not there Do you have a care partner and transportation: Yes Any additional concerns? Pt stated that she have some congestion in her chest right now and was trying to wait to pick up prep because if congestion is not cleared she may have to cancel/reschedule procedure

## 2024-05-10 ENCOUNTER — Ambulatory Visit: Payer: Self-pay | Admitting: Family

## 2024-05-14 ENCOUNTER — Telehealth: Payer: Self-pay

## 2024-05-14 NOTE — Telephone Encounter (Signed)
 Patient is scheduled for a hospital procedure with Dr. Mcgreal on 05/16/24.  She reports chest congestion and diarrhea.  She would like to discuss if she needs to cancel or reschedule.

## 2024-05-14 NOTE — Telephone Encounter (Signed)
 Pt stated that she has a cold with Chest congestion and a cough. Pt questioned her upcoming procedure. Pt was notified that we would have to reschedule. Pt was notified that I would cancel her procedure for 05/16/2024 and will call her back at a later date to get her rescheduled.  Scheduling was contacted and procedure was canceled.  Pt verbalized understanding with all questions answered.

## 2024-05-16 ENCOUNTER — Ambulatory Visit (HOSPITAL_COMMUNITY): Admission: RE | Admit: 2024-05-16 | Source: Home / Self Care | Admitting: Pediatrics

## 2024-05-16 ENCOUNTER — Encounter (HOSPITAL_COMMUNITY): Admission: RE | Payer: Self-pay | Source: Home / Self Care

## 2024-05-16 SURGERY — COLONOSCOPY
Anesthesia: Monitor Anesthesia Care

## 2024-05-16 NOTE — Telephone Encounter (Signed)
 Called patient to discuss open dates for hospital procedures coming up & patient stated she would prefer to call back and reschedule when ready.

## 2024-05-30 ENCOUNTER — Ambulatory Visit: Admitting: Family
